# Patient Record
Sex: Female | Born: 1935 | Race: White | Hispanic: No | Smoking: Never smoker
Health system: Southern US, Community
[De-identification: ages and names within clinical notes are randomized; demographics above are authoritative.]

## PROBLEM LIST (undated history)

## (undated) DIAGNOSIS — I10 Essential (primary) hypertension: Secondary | ICD-10-CM

## (undated) DIAGNOSIS — M1990 Unspecified osteoarthritis, unspecified site: Secondary | ICD-10-CM

## (undated) DIAGNOSIS — N289 Disorder of kidney and ureter, unspecified: Secondary | ICD-10-CM

## (undated) DIAGNOSIS — K222 Esophageal obstruction: Secondary | ICD-10-CM

## (undated) HISTORY — DX: Esophageal obstruction: K22.2

## (undated) HISTORY — DX: Essential (primary) hypertension: I10

## (undated) HISTORY — PX: CATARACT EXTRACTION: SUR2

## (undated) HISTORY — DX: Disorder of kidney and ureter, unspecified: N28.9

## (undated) HISTORY — DX: Unspecified osteoarthritis, unspecified site: M19.90

---

## 1998-07-24 ENCOUNTER — Ambulatory Visit (HOSPITAL_COMMUNITY): Admission: RE | Admit: 1998-07-24 | Discharge: 1998-07-24 | Payer: Self-pay | Admitting: Gastroenterology

## 1999-07-01 ENCOUNTER — Encounter: Payer: Self-pay | Admitting: Internal Medicine

## 1999-07-01 ENCOUNTER — Inpatient Hospital Stay (HOSPITAL_COMMUNITY): Admission: EM | Admit: 1999-07-01 | Discharge: 1999-07-10 | Payer: Self-pay | Admitting: Emergency Medicine

## 1999-07-01 ENCOUNTER — Encounter (INDEPENDENT_AMBULATORY_CARE_PROVIDER_SITE_OTHER): Payer: Self-pay

## 1999-07-03 ENCOUNTER — Encounter: Payer: Self-pay | Admitting: Internal Medicine

## 1999-07-07 ENCOUNTER — Encounter: Payer: Self-pay | Admitting: General Surgery

## 2000-09-10 ENCOUNTER — Other Ambulatory Visit: Admission: RE | Admit: 2000-09-10 | Discharge: 2000-09-10 | Payer: Self-pay | Admitting: Obstetrics and Gynecology

## 2000-09-10 ENCOUNTER — Encounter (INDEPENDENT_AMBULATORY_CARE_PROVIDER_SITE_OTHER): Payer: Self-pay

## 2001-03-29 ENCOUNTER — Ambulatory Visit (HOSPITAL_COMMUNITY): Admission: RE | Admit: 2001-03-29 | Discharge: 2001-03-29 | Payer: Self-pay | Admitting: Gastroenterology

## 2001-04-06 ENCOUNTER — Ambulatory Visit (HOSPITAL_COMMUNITY): Admission: RE | Admit: 2001-04-06 | Discharge: 2001-04-06 | Payer: Self-pay | Admitting: Gastroenterology

## 2001-04-06 ENCOUNTER — Encounter: Payer: Self-pay | Admitting: Gastroenterology

## 2006-07-07 ENCOUNTER — Ambulatory Visit: Payer: Self-pay | Admitting: Internal Medicine

## 2006-07-21 ENCOUNTER — Ambulatory Visit: Payer: Self-pay | Admitting: Internal Medicine

## 2007-05-23 ENCOUNTER — Ambulatory Visit: Payer: Self-pay | Admitting: Internal Medicine

## 2007-05-23 DIAGNOSIS — I129 Hypertensive chronic kidney disease with stage 1 through stage 4 chronic kidney disease, or unspecified chronic kidney disease: Secondary | ICD-10-CM | POA: Insufficient documentation

## 2007-05-23 DIAGNOSIS — M1990 Unspecified osteoarthritis, unspecified site: Secondary | ICD-10-CM | POA: Insufficient documentation

## 2007-05-23 DIAGNOSIS — M79609 Pain in unspecified limb: Secondary | ICD-10-CM

## 2008-09-03 ENCOUNTER — Ambulatory Visit (HOSPITAL_COMMUNITY): Admission: RE | Admit: 2008-09-03 | Discharge: 2008-09-03 | Payer: Self-pay | Admitting: Gastroenterology

## 2008-09-05 ENCOUNTER — Ambulatory Visit: Payer: Self-pay | Admitting: Internal Medicine

## 2008-09-05 DIAGNOSIS — H409 Unspecified glaucoma: Secondary | ICD-10-CM | POA: Insufficient documentation

## 2008-09-05 DIAGNOSIS — K222 Esophageal obstruction: Secondary | ICD-10-CM

## 2008-09-06 ENCOUNTER — Telehealth: Payer: Self-pay | Admitting: Internal Medicine

## 2008-09-06 LAB — CONVERTED CEMR LAB
ALT: 13 units/L (ref 0–35)
AST: 22 units/L (ref 0–37)
Albumin: 3.9 g/dL (ref 3.5–5.2)
Alkaline Phosphatase: 64 units/L (ref 39–117)
BUN: 21 mg/dL (ref 6–23)
Basophils Absolute: 0.1 10*3/uL (ref 0.0–0.1)
Basophils Relative: 2.2 % (ref 0.0–3.0)
Bilirubin, Direct: 0.1 mg/dL (ref 0.0–0.3)
CO2: 29 meq/L (ref 19–32)
Calcium: 9.1 mg/dL (ref 8.4–10.5)
Chloride: 109 meq/L (ref 96–112)
Creatinine, Ser: 1.1 mg/dL (ref 0.4–1.2)
Eosinophils Absolute: 0.2 10*3/uL (ref 0.0–0.7)
Eosinophils Relative: 3.8 % (ref 0.0–5.0)
GFR calc non Af Amer: 49.48 mL/min (ref >60)
Glucose, Bld: 125 mg/dL — ABNORMAL HIGH (ref 70–99)
HCT: 45.7 % (ref 36.0–46.0)
Hemoglobin: 15.4 g/dL — ABNORMAL HIGH (ref 12.0–15.0)
Lymphocytes Relative: 38.5 % (ref 12.0–46.0)
Lymphs Abs: 2.3 10*3/uL (ref 0.7–4.0)
MCHC: 33.6 g/dL (ref 30.0–36.0)
MCV: 84 fL (ref 78.0–100.0)
Monocytes Absolute: 0.6 10*3/uL (ref 0.1–1.0)
Monocytes Relative: 9.5 % (ref 3.0–12.0)
Neutro Abs: 2.7 10*3/uL (ref 1.4–7.7)
Neutrophils Relative %: 46 % (ref 43.0–77.0)
Platelets: 168 10*3/uL (ref 150.0–400.0)
Potassium: 3.8 meq/L (ref 3.5–5.1)
RBC: 5.44 M/uL — ABNORMAL HIGH (ref 3.87–5.11)
RDW: 13.6 % (ref 11.5–14.6)
Sodium: 144 meq/L (ref 135–145)
TSH: 1.18 microintl units/mL (ref 0.35–5.50)
Total Bilirubin: 0.7 mg/dL (ref 0.3–1.2)
Total Protein: 6.8 g/dL (ref 6.0–8.3)
WBC: 5.9 10*3/uL (ref 4.5–10.5)

## 2009-01-15 ENCOUNTER — Ambulatory Visit: Payer: Self-pay | Admitting: Internal Medicine

## 2009-01-15 DIAGNOSIS — R269 Unspecified abnormalities of gait and mobility: Secondary | ICD-10-CM

## 2009-04-17 ENCOUNTER — Ambulatory Visit: Payer: Self-pay | Admitting: Internal Medicine

## 2009-04-17 DIAGNOSIS — R7309 Other abnormal glucose: Secondary | ICD-10-CM

## 2009-05-08 ENCOUNTER — Telehealth: Payer: Self-pay | Admitting: Internal Medicine

## 2009-05-30 ENCOUNTER — Telehealth: Payer: Self-pay | Admitting: Internal Medicine

## 2009-06-17 ENCOUNTER — Ambulatory Visit (HOSPITAL_COMMUNITY): Admission: RE | Admit: 2009-06-17 | Discharge: 2009-06-17 | Payer: Self-pay | Admitting: Gastroenterology

## 2009-07-03 ENCOUNTER — Ambulatory Visit (HOSPITAL_COMMUNITY): Admission: RE | Admit: 2009-07-03 | Discharge: 2009-07-03 | Payer: Self-pay | Admitting: Gastroenterology

## 2009-09-23 ENCOUNTER — Ambulatory Visit: Payer: Self-pay | Admitting: Internal Medicine

## 2009-09-23 LAB — CONVERTED CEMR LAB
ALT: 18 units/L (ref 0–35)
AST: 18 units/L (ref 0–37)
Albumin: 3.9 g/dL (ref 3.5–5.2)
BUN: 24 mg/dL — ABNORMAL HIGH (ref 6–23)
Basophils Absolute: 0.1 10*3/uL (ref 0.0–0.1)
Chloride: 104 meq/L (ref 96–112)
Creatinine, Ser: 1.4 mg/dL — ABNORMAL HIGH (ref 0.4–1.2)
Eosinophils Relative: 5.8 % — ABNORMAL HIGH (ref 0.0–5.0)
GFR calc non Af Amer: 37.37 mL/min (ref >60)
Glucose, Bld: 104 mg/dL — ABNORMAL HIGH (ref 70–99)
HCT: 42.5 % (ref 36.0–46.0)
Hgb A1c MFr Bld: 5.8 % (ref 4.6–6.5)
Lymphocytes Relative: 36.5 % (ref 12.0–46.0)
Lymphs Abs: 2.2 10*3/uL (ref 0.7–4.0)
Monocytes Relative: 9.8 % (ref 3.0–12.0)
Platelets: 194 10*3/uL (ref 150.0–400.0)
RDW: 13.3 % (ref 11.5–14.6)
TSH: 2.75 microintl units/mL (ref 0.35–5.50)
Total Bilirubin: 0.5 mg/dL (ref 0.3–1.2)
WBC: 6.1 10*3/uL (ref 4.5–10.5)

## 2009-09-27 ENCOUNTER — Ambulatory Visit: Payer: Self-pay | Admitting: Internal Medicine

## 2009-09-27 DIAGNOSIS — N259 Disorder resulting from impaired renal tubular function, unspecified: Secondary | ICD-10-CM

## 2009-10-29 ENCOUNTER — Telehealth: Payer: Self-pay | Admitting: Internal Medicine

## 2010-07-01 NOTE — Progress Notes (Signed)
Summary: REQ FOR RX  Phone Note Call from Patient   Caller: Daughter Adell Singletary - [phone] Summary of Call: [place]. She is c/o bursitis in her elbow and is req rx to help.  Initial call taken by: Lamar Sprinkles, CMA,  Oct 29, 2009 9:02 AM  Follow-up for Phone Call        Is it red and swollen?  Follow-up by: Tresa Garter MD,  Oct 29, 2009 2:21 PM  Additional Follow-up for Phone Call Additional follow up Details #1::        Pt c/o arm pain, between elbow and shoulder. Pain is w/movement. No swelling, redness, heat or recent injury. She has taken nothing OTC due to glaucoma.  Additional Follow-up by: Lamar Sprinkles, CMA,  Oct 29, 2009 3:51 PM    Additional Follow-up for Phone Call Additional follow up Details #2::    Use Voltaren gel two times a day Ice Advil 1 tab two times a day pc x 1 wk Go to UC if bad Follow-up by: Tresa Garter MD,  Oct 29, 2009 5:30 PM  Additional Follow-up for Phone Call Additional follow up Details #3:: Details for Additional Follow-up Action Taken: Pt informed, [facility] Additional Follow-up by: Lamar Sprinkles, CMA,  October 30, 2009 8:38 AM  Prescriptions: VOLTAREN 1 %  GEL (DICLOFENAC SODIUM) two times a day as needed pain  #1 mth x 0   Entered by:   Lamar Sprinkles, CMA   Authorized by:   Tresa Garter MD   Signed by:   Lamar Sprinkles, CMA on 10/30/2009   Method used:   [facility]ron[facility] to        [facility]  [phone]* (retail)       [address] [place], [place]  [address]       Ph: [phone] or [phone]       Fax: [phone]   RxID:   [id number]

## 2010-07-01 NOTE — Assessment & Plan Note (Signed)
Summary: 6 MO ROV /NWS   Vital Signs:  Patient profile:   77 year old female Height:      59 inches (149.86 cm) Weight:      136.50 pounds (62.05 kg) BMI:     27.67 O2 Sat:      95 % on Room air Temp:     97.5 degrees F (36.39 degrees C) oral Pulse rate:   77 / minute BP sitting:   140 / 80  (left arm) Cuff size:   regular  Vitals Entered By: Josph Macho RMA (September 27, 2009 3:56 PM)  O2 Flow:  Room air CC: 6 month follow up/ Pts daughter states pt is no longer taking Naprosyn or using Voltarn gel/ pt is using 2 different eye drops but doesn't remember name of them/ CF Is Patient Diabetic? No   CC:  6 month follow up/ Pts daughter states pt is no longer taking Naprosyn or using Voltarn gel/ pt is using 2 different eye drops but doesn't remember name of them/ CF.  History of Present Illness: F/u HTN, OA, GERD w/strictures  Current Medications (verified): 1)  Naprosyn 500 Mg Tabs (Naproxen) .Marland Kitchen.. 1 Two Times A Day Pc As Needed Pain 2)  Voltaren 1 %  Gel (Diclofenac Sodium) .... Two Times A Day As Needed Pain 3)  Vitamin D3 1000 Unit  Tabs (Cholecalciferol) .Marland Kitchen.. 1 By Mouth Daily 4)  Tribenzor 40-5-12.5 Mg Tabs (Olmesartan-Amlodipine-Hctz) .Marland Kitchen.. 1 By Mouth Qd 5)  Prilosec .... Once Daily  Allergies (verified): No Known Drug Allergies  Past History:  Social History: Last updated: 09/05/2008 Retired Single moved to the [employer] [address]  Past Medical History: Hypertension Osteoarthritis Glaucoma Esoph strictures Dr Kinnie Scales Renal insufficiency  Past Surgical History: Cataract extraction  Review of Systems  The patient denies weight loss, weight gain, chest pain, and prolonged cough.    Physical Exam  General:  Well-developed,well-nourished,in no acute distress; alert,appropriate and cooperative throughout examination Nose:  External nasal examination shows no deformity or inflammation. Nasal mucosa are pink and moist without lesions or  exudates. Mouth:  Oral mucosa and oropharynx without lesions or exudates.  Teeth in good repair. Lungs:  Normal respiratory effort, chest expands symmetrically. Lungs are clear to auscultation, no crackles or wheezes. Heart:  Normal rate and regular rhythm. S1 and S2 normal without gallop, murmur, click, rub or other extra sounds. Abdomen:  Bowel sounds positive,abdomen soft and non-tender without masses, organomegaly or hernias noted. Neurologic:  alert & oriented X3, DTRs symmetrical and normal, Romberg negative, and ataxic gait.   Psych:  Oriented X3 and good eye contact.     Impression & Recommendations:  Problem # 1:  OSTEOARTHRITIS (ICD-715.90) Assessment Deteriorated  Her updated medication list for this problem includes:    Naprosyn 500 Mg Tabs (Naproxen) .Marland Kitchen... 1 two times a day pc as needed pain  Problem # 2:  ABNORMALITY OF GAIT (ICD-781.2) Assessment: Unchanged See "Patient Instructions".   Problem # 3:  HYPERTENSION (ICD-401.9) Assessment: Improved  Her updated medication list for this problem includes:    Tribenzor 40-5-12.5 Mg Tabs (Olmesartan-amlodipine-hctz) .Marland Kitchen... 1 by mouth qd  Problem # 4:  GLAUCOMA NOS (ICD-365.9) Assessment: Unchanged On prescription drug  therapy   Complete Medication List: 1)  Naprosyn 500 Mg Tabs (Naproxen) .Marland Kitchen.. 1 two times a day pc as needed pain 2)  Voltaren 1 % Gel (Diclofenac sodium) .... Two times a day as needed pain 3)  Vitamin D3 1000 Unit Tabs (Cholecalciferol) .Marland KitchenMarland KitchenMarland Kitchen  1 by mouth daily 4)  Tribenzor 40-5-12.5 Mg Tabs (Olmesartan-amlodipine-hctz) .Marland Kitchen.. 1 by mouth qd 5)  Prilosec  .... Once daily  Patient Instructions: 1)  Start Silver Sne[facility] at [facility] 2)  Please schedule a follow-up appointment in 6 months. 3)  BMP prior to visit, ICD-9: 4)  Hepatic Panel prior to visit, ICD-9: 401.1  995.20 5)  TSH prior to visit, ICD-9: 6)  CBC w/ Diff prior to visit, ICD-9: 7)  HbgA1C prior to visit, ICD-9:

## 2010-08-29 ENCOUNTER — Telehealth: Payer: Self-pay | Admitting: *Deleted

## 2010-08-29 NOTE — Telephone Encounter (Signed)
Pt "turned" her ankle Sunday. She c/o swelling and pain but states it improved in a couple days. There is still some discomfort and swelling. She has be elevating foot, used ice in the first few days and ibuprofen for pain. Her family was worried there still may be a fracture. I offered an apt Saturday but she wants an apt with you. I told her to call on call service this weekend for advisement if symptoms changed and would call her back Monday for update. Ok to work in next week if not better?

## 2010-09-01 NOTE — Telephone Encounter (Signed)
Spoke with patient, who states she is not much better, she request to see MD. Per scheduler pt can be seen 09/04/10 at 9am.  Pt notified

## 2010-09-01 NOTE — Telephone Encounter (Signed)
Left pt vm to call office back to see how she was doing today.

## 2010-09-01 NOTE — Telephone Encounter (Signed)
OK to w/in in acute OPEN slot if we have it. Thank you!

## 2010-09-04 ENCOUNTER — Ambulatory Visit (INDEPENDENT_AMBULATORY_CARE_PROVIDER_SITE_OTHER)
Admission: RE | Admit: 2010-09-04 | Discharge: 2010-09-04 | Disposition: A | Discharge status: Home / Self Care | Payer: Medicare HMO | Source: Ambulatory Visit | Attending: Internal Medicine | Admitting: Internal Medicine

## 2010-09-04 ENCOUNTER — Encounter: Payer: Self-pay | Admitting: Internal Medicine

## 2010-09-04 ENCOUNTER — Ambulatory Visit (INDEPENDENT_AMBULATORY_CARE_PROVIDER_SITE_OTHER): Payer: Medicare HMO | Admitting: Internal Medicine

## 2010-09-04 ENCOUNTER — Telehealth: Payer: Self-pay | Admitting: *Deleted

## 2010-09-04 DIAGNOSIS — S93409A Sprain of unspecified ligament of unspecified ankle, initial encounter: Secondary | ICD-10-CM

## 2010-09-04 DIAGNOSIS — I10 Essential (primary) hypertension: Secondary | ICD-10-CM

## 2010-09-04 MED ORDER — OLMESARTAN-AMLODIPINE-HCTZ 40-5-12.5 MG PO TABS: 1.0000 | ORAL_TABLET | Freq: Every day | ORAL | Status: DC

## 2010-09-04 NOTE — Assessment & Plan Note (Signed)
ACE wrap, elevation Will xray today

## 2010-09-04 NOTE — Telephone Encounter (Signed)
Pt has fracture - Per MD needs to see ortho. I set pt up for apt w/ [facility] today at 2:40 - to arrive 30 min prior. Daughter and patient aware.

## 2010-09-04 NOTE — Progress Notes (Signed)
  Subjective:    Patient ID: Mckenzie Young, female    DOB: 01-Sep-1915, 77 y.o.   MRN: [id number]  HPI  C/o L ankle sprained 1 week ago. C/o pain and swelling. F/u HTN  Review of Systems  Respiratory: Negative for cough and chest tightness.   Cardiovascular: Positive for leg swelling (L). Negative for chest pain and palpitations.  Genitourinary: Negative for difficulty urinating.       Objective:   Physical Exam  Constitutional: She appears well-developed. No distress.  Eyes: Pupils are equal, round, and reactive to light.  Neck: No thyromegaly present.  Cardiovascular: Normal heart sounds.   Pulmonary/Chest: She has no rales.  Abdominal: She exhibits no distension.  Musculoskeletal:       L ankle - as above  Skin: Skin is warm. No erythema.      L ankle is bruised and swollen, tender laterally    Assessment & Plan:    Ankle sprain L ACE wrap, elevation Will xray today  HYPERTENSION Will renew meds

## 2010-09-04 NOTE — Assessment & Plan Note (Signed)
Will renew meds

## 2010-09-04 NOTE — Telephone Encounter (Signed)
Thank you :)

## 2010-09-04 NOTE — Patient Instructions (Signed)
Elevate left ankle

## 2010-09-11 ENCOUNTER — Other Ambulatory Visit: Payer: Self-pay | Admitting: Internal Medicine

## 2010-09-11 NOTE — Telephone Encounter (Signed)
OK to fill this prescription with additional refills x11 Thank you!  

## 2010-10-10 ENCOUNTER — Ambulatory Visit: Payer: Self-pay | Admitting: Internal Medicine

## 2010-10-14 NOTE — H&P (Signed)
NAME:  Mckenzie Young, Mckenzie Young             ACCOUNT NO.:  [id number]   MEDICAL RECORD NO.:  [id number]          PATIENT TYPE:  AMB   LOCATION:  ENDO                         FACILITY:  [facility]   PHYSICIAN:  James L. Malon Kindle., M.D.DATE OF BIRTH:  [birth year]   DATE OF ADMISSION:  09/03/2008  DATE OF DISCHARGE:                              HISTORY & PHYSICAL   REASONS FOR EVALUATION:  Obstructive esophagus.   HISTORY:  A 77 year old patient who has seen Dr. Kinnie Scales.  She had an  endoscopy with removal of mid impaction 96 and apparently it has been  dilated with a Savary dilation in 2002.  She was eating lamb last night  and feels that it has not passed.  She has had continued regurgitation.  She was sent over to remove any mid impaction, repair of a rectal  fistula, tonsillectomy, D and C, cholecystectomy, and cataract surgery.   SOCIAL HISTORY:  She is was living independently at 62, has a daughter,  does not smoke or drink.   PHYSICAL EXAMINATION:  VITAL SIGNS:  Blood pressure 203/94, pulse 79,  sinus __________ masses.  HEART:  Regular rate and rhythm.  No murmurs or gallops.  LUNGS:  Clear.  ABDOMEN:  Soft and nontender.   ASSESSMENT:  1. Obstructive esophagus over 24 hours.  This will be quite risky if      we try to get this out if it is impacted.  I have gone over this in      detail with her including the fact that she could have a      perforation and may even need to go to Surgery to have this removed      by a thoracic surgeon.  2. Hypertension.  This definitely needs to be treated.   PLAN:  We will go ahead with an endoscopy today, rest discussed as  above.            ______________________________  Llana Aliment. Malon Kindle., M.D.     Waldron Session  D:  09/03/2008  T:  09/04/2008  Job:  [id number]   cc:   Georgina Quint. Plotnikov, MD  Griffith Citron, M.D.

## 2010-10-14 NOTE — Op Note (Signed)
NAME:  Mckenzie Young, Mckenzie Young             ACCOUNT NO.:  [id number]   MEDICAL RECORD NO.:  [id number]          PATIENT TYPE:  AMB   LOCATION:  ENDO                         FACILITY:  [facility]   PHYSICIAN:  James L. Malon Kindle., M.D.DATE OF BIRTH:  10-10-1915   DATE OF PROCEDURE:  09/03/2008  DATE OF DISCHARGE:                               OPERATIVE REPORT   SURGEON:  Fayrene Fearing L. Edwards, MD   PROCEDURE:  Esophagogastroduodenoscopy with removal of food impaction.   MEDICATIONS:  Hurricaine spray, fentanyl 25 mcg, Versed 3 mg IV.   INDICATIONS:  The patient had a history of esophageal stricture and food  impaction of the esophagus, comes in now with lamb that has been in her  esophagus for over 24 hours.  She also was quite hypertensive.   DESCRIPTION OF PROCEDURE:  The procedure including the potential risk  and benefits have been explained to her and consent obtained.  Left  lateral decubitus position, the Pentax scope was inserted and advanced.  The distal esophagus was reached and cleared.  There was a large meat  bolus in the esophagus.  We then used the [employer],  were able to work this surrounded and gently dislodged it and removed it  through the mouth.  The scope was then reinserted and advanced.  The  distal esophagus did reveal stricture with marked inflammation.  The  stomach was easily entered.  Pylorus identified and passed.  Duodenum  including bulb and second portion was seen well.  The scope was  withdrawn and the additional findings confirmed.  There were no  immediate complications.   ASSESSMENT:  1. Food impaction at the esophagus removed.  2. Inflamed stricture.  3. Elevated blood pressure.   PLAN:  We will start on over-the-counter Prilosec and clear liquids for  today; however, followup with Dr. Kinnie Scales in the future to see about and  dilatation and will have her followup with Dr. Posey Rea regarding her  blood pressure.     ______________________________  Llana Aliment. Malon Kindle., M.D.     Waldron Session  D:  09/03/2008  T:  09/04/2008  Job:  [id number]   cc:   Georgina Quint. Plotnikov, MD  Griffith Citron, M.D.

## 2010-10-17 NOTE — Consult Note (Signed)
[place]  Patient:    JUMANA, PACCIONE                      MRN: [id number] Proc. Date: 07/02/99 Attending:  Griffith Citron, M.D. CC:         Sonda Primes, M.D. [facility]             Timothy E. Earlene Plater, M.D.                          Consultation Report  REASON FOR CONSULTATION:  Asked by Dr. Posey Rea to evaluate Ms. Ladona Ridgel for abdominal pain and abnormal liver enzymes.  HISTORY OF PRESENT ILLNESS:  Acute onset of symptoms awakening from sleep early  Sunday morning.  Pain was right upper quadrant, epigastric, radiating into the back.  Subsequent nausea, vomiting with some improvement.  Symptoms have recurred the following day and she was ultimately admitted to the hospital yesterday due to persistent intractable nausea, vomiting with right upper quadrant abdominal pain. The patient has no prior history of any similar episode.  On admission, she was  found to be acutely ill, afebrile with stable vital signs.  The examination was  notable for abdominal pain in the periumbilical region.  Surgical consultation as obtained from Dr. Lorelee New, who has been following the patient over the past 48 hours.  Admission labs revealed bilirubin 7.3, alkaline phosphatase 206, AST 102, ALT 135. Abdominal ultrasound revealed cholelithiasis with a thickened gallbladder wall, but without pericholecystic fluid.  No intrahepatic or extrahepatic bile duct dilation. Normal appearing pancreas.  The patient was thought to have chronic cholecystitis with cholelithiasis.  Multiple renal cysts were also noted.  The patients abdominal pain has improved over the past 36 hours.  However, she developed a rapid heart rate and ultimately went into atrial fibrillation. With this she has mild tachypnea and dyspnea on exertion.  PAST MEDICAL HISTORY:  GERD, hypertension.  MEDICAL THERAPY:  Avapro, Aciphex.  ALLERGIES:  No known drug allergies.  SOCIAL HISTORY:   The patient lives with husband of 60 years in an apartment. No alcohol, tobacco, or drug use.  The patient is [employer], not practicing. Two daughters ages 69 and 25 who are healthy.  FAMILY HISTORY:  Parents deceased, mother from CVA, father uncertain cause. Sister deceased from uncertain cause.  Two brothers deceased, 71 and 61, both from CVA.  OBJECTIVE:  GENERAL:  Mildly ill-appearing.  Alert, oriented.  No acute distress.  Mild tachypnea.  VITAL SIGNS:  Stable except respirations 22 per minute.  Pulse 100, irregularly  irregular.  Cardizem drip initiated.  HEENT:  Icteric sclerae.  No pallor.  EOMI.  PERRL.  Dry oropharyngeal mucosa.   NECK:  Supple with increased JVD to the angle of the jaw at 30 degrees.  No adenopathy.  No thyromegaly.  CHEST:  Bibasilar rales, right greater than left.  No consolidation.  No wheezing.  CARDIAC:  Irregularly irregular, rapid rhythm.  No gallop.  No murmur appreciated.  ABDOMEN:  Obese, soft.  Tender right upper quadrant without guarding or rebound. No palpable organomegaly.  No mass or firmness.  Bowel sounds are present. Nondistended.  No rebound or guarding.  RECTAL:  Not performed.  EXTREMITIES:  No clubbing, cyanosis, or edema.  Abdominal ultrasound:  Cholelithiasis with thick walled gallbladder.  No pericholecystic fluid.  No intra or extrahepatic bile duct dilation.  LABORATORY DATA:  CMET abnormal for total bilirubin 7.3,  alkaline phosphatase 206, AST 102, ALT 135.  CBC normal except WBC 15,600.  Normal amylase and lipase.  ASSESSMENT: 1. Acute/chronic calculus cholecystitis. 2. Bile duct stone-possible spontaneous passage given evidence of cholestasis    probably due to transient extrahepatic obstruction.  Retained stone may still be    present with delayed dilation of the biliary tree which typically takes three to    four days before manifesting.  Therefore, initial ultrasound may have been too    early to  demonstrate extrahepatic obstruction and may need to be repeated if  liver enzymes worsen. 3. Atrial fibrillation, new onset with mild congestive heart failure.  RECOMMENDATION: 1. Keep n.p.o. to monitor lab-rising LFTs would suggest extrahepatic obstruction    secondary to common bile duct stone.  This in turn should precipitate repeat  abdominal ultrasound, then consideration for an ERCP. 2. Cholecystectomy with intraoperative cholangiogram once cardiac status    stabilizes. 3. Continue antibiotic coverage. 4. I will be gone for the next 48 hours.  I will see the patient upon my return  Friday afternoon.  I can be reached at [phone] for further discussion. DD:  07/02/99 TD:  07/02/99 Job: [id number]

## 2010-10-17 NOTE — Assessment & Plan Note (Signed)
[facility]                           PRIMARY CARE OFFICE NOTE   Mckenzie Young, Mckenzie Young                      MRN:          [id number]  DATE:07/08/2006                            DOB:          03-Nov-1915    The patient is an 77 year old female who has not been seen here in over  three years, comes in today urgently with complaint of severe shooting  pain in the right side of her head that started a day or two ago.  Shooting pains last for a split second, go behind and above the right  ear.  They start in the neck area.   PAST MEDICAL HISTORY:  Hypertension, GERD, dyslipidemia, history of  elevated glucose.   PAST SURGICAL HISTORY:  Cholecystectomy.   [place]IAL H[place]ORY[place].  She does not smoke or drink alcohol.   FAMILY HISTORY:  Both parents deceased.   REVIEW OF SYSTEMS:  No chest pain or shortness of breath.  Headache as  above.  No nausea and vomiting.  The rest of 18 point review of systems  is negative.   CURRENT MEDICATIONS:  Avapro 300 mg daily, Clonidine 0.2 mg b.i.d.,  AcipHex 20 mg daily, baby aspirin daily.   PHYSICAL EXAMINATION:  VITAL SIGNS:  Blood pressure 174/97, pulse 110,  temperature 97, weight 137 pounds.  GENERAL:  She is in no acute distress.  HEENT:  Moist mucosa.  NECK:  Supple, no thyromegaly or bruit.  LUNGS:  Clear, no wheezes or rales.  HEART:  S1 and S2, no murmur, no gallop.  ABDOMEN:  Soft, nontender, no organomegaly, no masses felt.  EXTREMITIES:  Lower extremities without edema.  NEUROLOGICAL:  She is alert, oriented, and cooperative.  Denies being  depressed.  SKIN:  Examination reveals three groups of vesicals on the raised  surface, small on the right side of the neck, similar patch over the  mastoid process on the right, and a patch measuring about 5 by 5 cm over  the right temporal area.  Very tender to palpation.   ASSESSMENT/PLAN:  1.  Headache, neuropathic, related to problem #2.  Prescribed Neurontin      100 mg, from 1 to 3 twice a day, Darvocet N100, 1 q.i.d. p.r.n.      pain.  If pain is mild, she can take Tylenol or Advil.  2. H. zoster.  Acyclovir 800 p.o. five times a day for seven days.  3. Hypertension with white coat component, she states it has been      normal at home.   I will see her back in two weeks.     Georgina Quint. Plotnikov, MD  Electronically Signed    AVP/MedQ  DD: 07/08/2006  DT: 07/08/2006  Job #: [id number]

## 2010-12-01 ENCOUNTER — Telehealth: Payer: Self-pay

## 2010-12-01 NOTE — Telephone Encounter (Signed)
Phamacist called stating that tribenzor is too expensive for patient, $170 after discount card. Please advise on alt med for patient. Thanks

## 2010-12-01 NOTE — Telephone Encounter (Signed)
Pls ask the pt if she is willing to switch Thx

## 2010-12-02 NOTE — Telephone Encounter (Signed)
Pt is willing to change - exforge is covered med. I  Gave pt samples x 8 wks for now. What would you like to change pt to?

## 2010-12-02 NOTE — Telephone Encounter (Signed)
OK Exforge hct 5-320-12.5 1 po qd Thx

## 2010-12-08 ENCOUNTER — Telehealth: Payer: Self-pay | Admitting: *Deleted

## 2010-12-08 NOTE — Telephone Encounter (Signed)
Dr Posey Rea, Exforge dosages are as follows: 10-320/160-25/12.5 and 5-160-25/12.5. Please advise on correct dosage? Thank you!

## 2010-12-08 NOTE — Telephone Encounter (Signed)
rec fax from pharm stating Tribenzor is no longer covered and is too expensive even with coupon. Please change to cheaper alt.

## 2010-12-08 NOTE — Telephone Encounter (Signed)
5-160-12.5 Thx

## 2010-12-08 NOTE — Telephone Encounter (Signed)
See other note Thx 

## 2010-12-09 MED ORDER — AMLODIPINE-VALSARTAN-HCTZ 5-160-12.5 MG PO TABS: 1.0000 | ORAL_TABLET | Freq: Every day | ORAL | Status: DC

## 2010-12-09 NOTE — Telephone Encounter (Signed)
Med has been changed to Exforge 5-160-12.5

## 2011-03-12 ENCOUNTER — Ambulatory Visit: Payer: Medicare HMO | Admitting: Internal Medicine

## 2011-03-13 ENCOUNTER — Telehealth: Payer: Self-pay | Admitting: *Deleted

## 2011-03-13 NOTE — Telephone Encounter (Signed)
Pt left vm requesting appt before November for her swollen/painful finger. I advised her Dr. Posey Rea is out of office all week next week of 03/16/11. I transferred her to scheduler to see who she can see next week.

## 2011-03-23 ENCOUNTER — Telehealth: Payer: Self-pay | Admitting: *Deleted

## 2011-03-23 NOTE — Telephone Encounter (Signed)
Pt left vm c/o 1st finger pain/edema. ? From gout. She is requesting Rx for this since we have no openings this week. Please advise.

## 2011-03-23 NOTE — Telephone Encounter (Signed)
OV w/any MD Try Advil 2 tabs bid pc Thx

## 2011-03-24 NOTE — Telephone Encounter (Signed)
Pt informed/transferred to scheduler.  

## 2011-03-25 ENCOUNTER — Other Ambulatory Visit (INDEPENDENT_AMBULATORY_CARE_PROVIDER_SITE_OTHER): Payer: Medicare HMO

## 2011-03-25 ENCOUNTER — Ambulatory Visit (INDEPENDENT_AMBULATORY_CARE_PROVIDER_SITE_OTHER): Payer: Medicare HMO | Admitting: Internal Medicine

## 2011-03-25 VITALS — BP 162/90 | HR 90 | Temp 97.6°F

## 2011-03-25 DIAGNOSIS — I10 Essential (primary) hypertension: Secondary | ICD-10-CM

## 2011-03-25 DIAGNOSIS — M79644 Pain in right finger(s): Secondary | ICD-10-CM

## 2011-03-25 DIAGNOSIS — M109 Gout, unspecified: Secondary | ICD-10-CM

## 2011-03-25 DIAGNOSIS — M79609 Pain in unspecified limb: Secondary | ICD-10-CM

## 2011-03-25 DIAGNOSIS — N259 Disorder resulting from impaired renal tubular function, unspecified: Secondary | ICD-10-CM

## 2011-03-25 LAB — BASIC METABOLIC PANEL
BUN: 24 mg/dL — ABNORMAL HIGH (ref 6–23)
GFR: 28.05 mL/min — ABNORMAL LOW (ref >60.00)
Glucose, Bld: 116 mg/dL — ABNORMAL HIGH (ref 70–99)
Potassium: 5.2 mEq/L — ABNORMAL HIGH (ref 3.5–5.1)

## 2011-03-25 MED ORDER — MELOXICAM 7.5 MG PO TABS: 7.5000 mg | ORAL_TABLET | Freq: Every day | ORAL | Status: DC

## 2011-03-25 MED ORDER — AMLODIPINE BESYLATE-VALSARTAN 5-160 MG PO TABS: 1.0000 | ORAL_TABLET | Freq: Every day | ORAL | Status: DC

## 2011-03-26 ENCOUNTER — Encounter: Payer: Self-pay | Admitting: Internal Medicine

## 2011-03-26 ENCOUNTER — Telehealth: Payer: Self-pay | Admitting: Internal Medicine

## 2011-03-26 NOTE — Telephone Encounter (Signed)
Patient informed. 

## 2011-03-26 NOTE — Telephone Encounter (Signed)
Mckenzie Young, please, inform patient that her gout test was up. Meds as we discussed. Keep ROV Thx

## 2011-03-26 NOTE — Telephone Encounter (Signed)
Number busy

## 2011-03-26 NOTE — Assessment & Plan Note (Signed)
Monitor K, creat

## 2011-03-26 NOTE — Assessment & Plan Note (Signed)
See Med changes 

## 2011-03-26 NOTE — Assessment & Plan Note (Signed)
Remove HCTZ Meloxicam

## 2011-03-26 NOTE — Assessment & Plan Note (Signed)
Labs Meloxicam

## 2011-03-26 NOTE — Progress Notes (Signed)
  Subjective:    Patient ID: Mckenzie Young, female    DOB: 1915-11-26, 77 y.o.   MRN: [id number]  HPI  C/o R index finger pain and swelling x 1 wk - not better w/rest F/u HTN, gout, CRI  Review of Systems  Constitutional: Negative for chills, activity change, appetite change, fatigue and unexpected weight change.  HENT: Negative for congestion, mouth sores and sinus pressure.   Eyes: Negative for visual disturbance.  Respiratory: Negative for cough and chest tightness.   Gastrointestinal: Negative for nausea and abdominal pain.  Genitourinary: Negative for frequency, difficulty urinating and vaginal pain.  Musculoskeletal: Positive for joint swelling (as above). Negative for back pain and gait problem.  Skin: Negative for pallor and rash.  Neurological: Negative for dizziness, tremors, weakness, numbness and headaches.  Psychiatric/Behavioral: Negative for confusion and sleep disturbance.       Objective:   Physical Exam  Constitutional: She appears well-developed. No distress.       obese  HENT:  Head: Normocephalic.  Right Ear: External ear normal.  Left Ear: External ear normal.  Nose: Nose normal.  Mouth/Throat: Oropharynx is clear and moist.  Eyes: Conjunctivae are normal. Pupils are equal, round, and reactive to light. Right eye exhibits no discharge. Left eye exhibits no discharge.  Neck: Normal range of motion. Neck supple. No JVD present. No tracheal deviation present. No thyromegaly present.  Cardiovascular: Normal rate, regular rhythm and normal heart sounds.   Pulmonary/Chest: No stridor. No respiratory distress. She has no wheezes.  Abdominal: Soft. Bowel sounds are normal. She exhibits no distension and no mass. There is no tenderness. There is no rebound and no guarding.  Musculoskeletal: She exhibits edema and tenderness.       R index finger is fat and tender;  No erythema  Lymphadenopathy:    She has no cervical adenopathy.  Neurological: She displays  normal reflexes. No cranial nerve deficit. She exhibits normal muscle tone. Coordination normal.       cane  Skin: No rash noted. No erythema.  Psychiatric: She has a normal mood and affect. Her behavior is normal. Judgment and thought content normal.     Lab Results  Component Value Date   WBC 6.1 09/23/2009   HGB 14.5 09/23/2009   HCT 42.5 09/23/2009   PLT 194.0 09/23/2009   GLUCOSE 116* 03/25/2011   ALT 18 09/23/2009   AST 18 09/23/2009   NA 143 03/25/2011   K 5.2* 03/25/2011   CL 107 03/25/2011   CREATININE 1.8* 03/25/2011   BUN 24* 03/25/2011   CO2 28 03/25/2011   TSH 2.75 09/23/2009   HGBA1C 5.8 09/23/2009        Assessment & Plan:

## 2011-04-17 ENCOUNTER — Ambulatory Visit (INDEPENDENT_AMBULATORY_CARE_PROVIDER_SITE_OTHER): Payer: Medicare HMO | Admitting: Internal Medicine

## 2011-04-17 ENCOUNTER — Encounter: Payer: Self-pay | Admitting: Internal Medicine

## 2011-04-17 DIAGNOSIS — R269 Unspecified abnormalities of gait and mobility: Secondary | ICD-10-CM

## 2011-04-17 DIAGNOSIS — M79609 Pain in unspecified limb: Secondary | ICD-10-CM

## 2011-04-17 DIAGNOSIS — I10 Essential (primary) hypertension: Secondary | ICD-10-CM

## 2011-04-17 DIAGNOSIS — M79644 Pain in right finger(s): Secondary | ICD-10-CM

## 2011-04-17 DIAGNOSIS — M109 Gout, unspecified: Secondary | ICD-10-CM

## 2011-04-17 MED ORDER — AMLODIPINE-OLMESARTAN 5-40 MG PO TABS: 1.0000 | ORAL_TABLET | Freq: Every day | ORAL | Status: DC

## 2011-04-17 NOTE — Assessment & Plan Note (Signed)
Change meds to Azor

## 2011-04-17 NOTE — Assessment & Plan Note (Signed)
Continue with current prescription therapy as reflected on the Med list -- off HCTZ

## 2011-04-17 NOTE — Assessment & Plan Note (Signed)
Start PT 

## 2011-04-17 NOTE — Patient Instructions (Signed)
Stop Meloxicam when the gout is gone

## 2011-04-17 NOTE — Progress Notes (Signed)
  Subjective:    Patient ID: Mckenzie Young, female    DOB: Jul 28, 1915, 77 y.o.   MRN: [id number]  HPI  F/u gout and anxiety, finger pain  Review of Systems  Constitutional: Negative for chills, activity change, appetite change, fatigue and unexpected weight change.  HENT: Negative for congestion, mouth sores and sinus pressure.   Eyes: Negative for visual disturbance.  Respiratory: Negative for cough and chest tightness.   Gastrointestinal: Negative for nausea and abdominal pain.  Genitourinary: Negative for frequency, difficulty urinating and vaginal pain.  Musculoskeletal: Negative for back pain, arthralgias and gait problem.  Skin: Negative for pallor and rash.  Neurological: Negative for dizziness, tremors, weakness, numbness and headaches.  Psychiatric/Behavioral: Negative for confusion and sleep disturbance.       Objective:   Physical Exam  Constitutional: She appears well-developed and well-nourished. No distress.  HENT:  Head: Normocephalic.  Right Ear: External ear normal.  Left Ear: External ear normal.  Nose: Nose normal.  Mouth/Throat: Oropharynx is clear and moist.  Eyes: Conjunctivae are normal. Pupils are equal, round, and reactive to light. Right eye exhibits no discharge. Left eye exhibits no discharge.  Neck: Normal range of motion. Neck supple. No JVD present. No tracheal deviation present. No thyromegaly present.  Cardiovascular: Normal rate, regular rhythm and normal heart sounds.   Pulmonary/Chest: No stridor. No respiratory distress. She has no wheezes.  Abdominal: Soft. Bowel sounds are normal. She exhibits no distension and no mass. There is no tenderness. There is no rebound and no guarding.  Musculoskeletal: She exhibits edema and tenderness.       R index is less swollen and less tender  Lymphadenopathy:    She has no cervical adenopathy.  Neurological: She displays normal reflexes. No cranial nerve deficit. She exhibits normal muscle tone.  Coordination normal.  Skin: No rash noted. No erythema.  Psychiatric: She has a normal mood and affect. Her behavior is normal. Judgment and thought content normal.  Poor balance Lab Results  Component Value Date   WBC 6.1 09/23/2009   HGB 14.5 09/23/2009   HCT 42.5 09/23/2009   PLT 194.0 09/23/2009   GLUCOSE 116* 03/25/2011   ALT 18 09/23/2009   AST 18 09/23/2009   NA 143 03/25/2011   K 5.2* 03/25/2011   CL 107 03/25/2011   CREATININE 1.8* 03/25/2011   BUN 24* 03/25/2011   CO2 28 03/25/2011   TSH 2.75 09/23/2009   HGBA1C 5.8 09/23/2009          Assessment & Plan:

## 2011-04-17 NOTE — Assessment & Plan Note (Signed)
85% better Cont Melox until better

## 2011-04-19 DIAGNOSIS — H409 Unspecified glaucoma: Secondary | ICD-10-CM | POA: Insufficient documentation

## 2011-04-19 DIAGNOSIS — H401190 Primary open-angle glaucoma, unspecified eye, stage unspecified: Secondary | ICD-10-CM | POA: Insufficient documentation

## 2011-05-01 ENCOUNTER — Ambulatory Visit: Payer: Medicare HMO | Attending: Internal Medicine | Admitting: Physical Therapy

## 2011-05-01 DIAGNOSIS — IMO0001 Reserved for inherently not codable concepts without codable children: Secondary | ICD-10-CM | POA: Insufficient documentation

## 2011-05-01 DIAGNOSIS — R269 Unspecified abnormalities of gait and mobility: Secondary | ICD-10-CM | POA: Insufficient documentation

## 2011-05-01 DIAGNOSIS — M6281 Muscle weakness (generalized): Secondary | ICD-10-CM | POA: Insufficient documentation

## 2011-05-05 ENCOUNTER — Ambulatory Visit: Payer: Medicare HMO | Attending: Internal Medicine | Admitting: Physical Therapy

## 2011-05-05 DIAGNOSIS — R269 Unspecified abnormalities of gait and mobility: Secondary | ICD-10-CM | POA: Insufficient documentation

## 2011-05-05 DIAGNOSIS — IMO0001 Reserved for inherently not codable concepts without codable children: Secondary | ICD-10-CM | POA: Insufficient documentation

## 2011-05-05 DIAGNOSIS — M6281 Muscle weakness (generalized): Secondary | ICD-10-CM | POA: Insufficient documentation

## 2011-05-07 ENCOUNTER — Ambulatory Visit: Payer: Medicare HMO | Admitting: Physical Therapy

## 2011-05-11 ENCOUNTER — Ambulatory Visit: Payer: Medicare HMO | Admitting: Physical Therapy

## 2011-05-13 ENCOUNTER — Ambulatory Visit: Payer: Medicare HMO | Admitting: Physical Therapy

## 2011-05-18 ENCOUNTER — Encounter: Payer: Medicare HMO | Admitting: Physical Therapy

## 2011-05-20 ENCOUNTER — Ambulatory Visit: Payer: Medicare HMO | Admitting: Physical Therapy

## 2011-05-21 ENCOUNTER — Encounter: Payer: Medicare HMO | Admitting: Physical Therapy

## 2011-05-22 ENCOUNTER — Ambulatory Visit: Payer: Medicare HMO | Admitting: Physical Therapy

## 2011-05-27 ENCOUNTER — Ambulatory Visit: Payer: Medicare HMO | Admitting: Physical Therapy

## 2011-05-29 ENCOUNTER — Ambulatory Visit: Payer: Medicare HMO | Admitting: Physical Therapy

## 2011-06-01 ENCOUNTER — Ambulatory Visit: Payer: Medicare HMO | Admitting: Physical Therapy

## 2011-06-03 ENCOUNTER — Ambulatory Visit: Payer: Medicare Other | Attending: Internal Medicine | Admitting: Physical Therapy

## 2011-06-03 DIAGNOSIS — R269 Unspecified abnormalities of gait and mobility: Secondary | ICD-10-CM | POA: Insufficient documentation

## 2011-06-03 DIAGNOSIS — M6281 Muscle weakness (generalized): Secondary | ICD-10-CM | POA: Insufficient documentation

## 2011-06-03 DIAGNOSIS — IMO0001 Reserved for inherently not codable concepts without codable children: Secondary | ICD-10-CM | POA: Insufficient documentation

## 2011-06-10 ENCOUNTER — Ambulatory Visit: Payer: Medicare Other | Admitting: Physical Therapy

## 2011-06-12 ENCOUNTER — Ambulatory Visit: Payer: Medicare Other | Admitting: Physical Therapy

## 2011-06-17 ENCOUNTER — Ambulatory Visit: Payer: Medicare Other | Admitting: Physical Therapy

## 2011-06-19 ENCOUNTER — Ambulatory Visit: Payer: MEDICARE | Admitting: Physical Therapy

## 2011-06-24 ENCOUNTER — Ambulatory Visit: Payer: Medicare Other | Admitting: Physical Therapy

## 2011-06-26 ENCOUNTER — Encounter: Payer: Medicare HMO | Admitting: Physical Therapy

## 2011-06-29 ENCOUNTER — Ambulatory Visit: Payer: Medicare Other | Admitting: Physical Therapy

## 2011-07-01 ENCOUNTER — Ambulatory Visit: Payer: MEDICARE | Admitting: Physical Therapy

## 2011-08-06 ENCOUNTER — Telehealth: Payer: Self-pay | Admitting: *Deleted

## 2011-08-06 NOTE — Telephone Encounter (Signed)
I would be unable to do this as these are nonspecific symptoms that could be due to other factors, and would be needed to be considered and evaluated by the prescribing MD who knows her other co-morbidities (health issues)

## 2011-08-06 NOTE — Telephone Encounter (Signed)
Pt calling stating she cant take Azor due to side effects ie. Itching around her neck and ankle edema. She wants to know if she can have replacement or should she continue.  Please advise in PCP's absence. Thanks

## 2011-08-07 NOTE — Telephone Encounter (Signed)
Called patient left message with son in law to call back

## 2011-08-07 NOTE — Telephone Encounter (Signed)
P/t's daughter informed

## 2011-08-17 ENCOUNTER — Other Ambulatory Visit (INDEPENDENT_AMBULATORY_CARE_PROVIDER_SITE_OTHER): Payer: Medicare Other

## 2011-08-17 DIAGNOSIS — I10 Essential (primary) hypertension: Secondary | ICD-10-CM

## 2011-08-17 DIAGNOSIS — M109 Gout, unspecified: Secondary | ICD-10-CM

## 2011-08-17 LAB — BASIC METABOLIC PANEL
BUN: 32 mg/dL — ABNORMAL HIGH (ref 6–23)
Calcium: 9.2 mg/dL (ref 8.4–10.5)
GFR: 29.15 mL/min — ABNORMAL LOW (ref >60.00)
Glucose, Bld: 103 mg/dL — ABNORMAL HIGH (ref 70–99)

## 2011-08-21 ENCOUNTER — Encounter: Payer: Self-pay | Admitting: Internal Medicine

## 2011-08-21 ENCOUNTER — Ambulatory Visit (INDEPENDENT_AMBULATORY_CARE_PROVIDER_SITE_OTHER): Payer: Medicare Other | Admitting: Internal Medicine

## 2011-08-21 VITALS — BP 120/80 | HR 84 | Temp 97.1°F | Resp 16 | Wt 127.0 lb

## 2011-08-21 DIAGNOSIS — I10 Essential (primary) hypertension: Secondary | ICD-10-CM

## 2011-08-21 DIAGNOSIS — N259 Disorder resulting from impaired renal tubular function, unspecified: Secondary | ICD-10-CM

## 2011-08-21 DIAGNOSIS — M109 Gout, unspecified: Secondary | ICD-10-CM

## 2011-08-21 DIAGNOSIS — R269 Unspecified abnormalities of gait and mobility: Secondary | ICD-10-CM

## 2011-08-21 NOTE — Assessment & Plan Note (Signed)
Watching labs 

## 2011-08-21 NOTE — Assessment & Plan Note (Signed)
Resolved

## 2011-08-21 NOTE — Assessment & Plan Note (Signed)
BP is nl now 3/13: She had some itching and L ankle swelling x few days, stopped benyc/amlo - sx's resolved. She restarted - no sx now

## 2011-08-21 NOTE — Assessment & Plan Note (Signed)
Multifctorial S/p PT eval - given a cane

## 2011-08-21 NOTE — Progress Notes (Signed)
Patient ID: Mckenzie Young, female   DOB: 1916/02/21, 78 y.o.   MRN: [id number]  Subjective:    Patient ID: Mckenzie Young, female    DOB: [birth year], 78 y.o.   MRN: [id number]  HPI  F/u gout and anxiety, finger pain - resolved F/u ankle swelling and itching - resolved F/u HTN, poor balance  Wt Readings from Last 3 Encounters:  08/21/11 127 lb (57.607 kg)  04/17/11 130 lb (58.968 kg)  09/04/10 129 lb (58.514 kg)   BP Readings from Last 3 Encounters:  08/21/11 120/80  04/17/11 168/90  03/25/11 162/90      Review of Systems  Constitutional: Negative for chills, activity change, appetite change, fatigue and unexpected weight change.  HENT: Negative for congestion, mouth sores and sinus pressure.   Eyes: Negative for visual disturbance.  Respiratory: Negative for cough and chest tightness.   Gastrointestinal: Negative for nausea and abdominal pain.  Genitourinary: Negative for frequency, difficulty urinating and vaginal pain.  Musculoskeletal: Negative for back pain, arthralgias and gait problem.  Skin: Negative for pallor and rash.  Neurological: Negative for dizziness, tremors, weakness, numbness and headaches.  Psychiatric/Behavioral: Negative for confusion and sleep disturbance.       Objective:   Physical Exam  Constitutional: She appears well-developed and well-nourished. No distress.  HENT:  Head: Normocephalic.  Right Ear: External ear normal.  Left Ear: External ear normal.  Nose: Nose normal.  Mouth/Throat: Oropharynx is clear and moist.  Eyes: Conjunctivae are normal. Pupils are equal, round, and reactive to light. Right eye exhibits no discharge. Left eye exhibits no discharge.  Neck: Normal range of motion. Neck supple. No JVD present. No tracheal deviation present. No thyromegaly present.  Cardiovascular: Normal rate, regular rhythm and normal heart sounds.   Pulmonary/Chest: No stridor. No respiratory distress. She has no wheezes.  Abdominal: Soft. Bowel  sounds are normal. She exhibits no distension and no mass. There is no tenderness. There is no rebound and no guarding.  Musculoskeletal: She exhibits no edema and no tenderness.  Lymphadenopathy:    She has no cervical adenopathy.  Neurological: She displays normal reflexes. No cranial nerve deficit. She exhibits normal muscle tone. Coordination normal.  Skin: No rash noted. No erythema.  Psychiatric: She has a normal mood and affect. Her behavior is normal. Judgment and thought content normal.  Poor balance - better w/cane Lab Results  Component Value Date   WBC 6.1 09/23/2009   HGB 14.5 09/23/2009   HCT 42.5 09/23/2009   PLT 194.0 09/23/2009   GLUCOSE 103* 08/17/2011   ALT 18 09/23/2009   AST 18 09/23/2009   NA 141 08/17/2011   K 4.4 08/17/2011   CL 108 08/17/2011   CREATININE 1.7* 08/17/2011   BUN 32* 08/17/2011   CO2 27 08/17/2011   TSH 2.75 09/23/2009   HGBA1C 5.8 09/23/2009          Assessment & Plan:

## 2011-08-21 NOTE — Assessment & Plan Note (Addendum)
Multifctorial S/p PT eval - given a cane Better

## 2012-01-11 ENCOUNTER — Telehealth: Payer: Self-pay | Admitting: Internal Medicine

## 2012-01-11 MED ORDER — AMLODIPINE-OLMESARTAN 5-40 MG PO TABS: 1.0000 | ORAL_TABLET | Freq: Every day | ORAL | Status: DC

## 2012-01-11 NOTE — Telephone Encounter (Signed)
The pt called the triage line and is hoping to get a refill for a 90 day supply of Azor.  Thanks!

## 2012-01-11 NOTE — Telephone Encounter (Signed)
Called pt no answer LMOM [facility]... 01/11/12@11 :08am/LMB

## 2012-01-12 ENCOUNTER — Other Ambulatory Visit: Payer: Self-pay

## 2012-01-12 MED ORDER — AMLODIPINE-OLMESARTAN 5-40 MG PO TABS: 1.0000 | ORAL_TABLET | Freq: Every day | ORAL | Status: DC

## 2012-03-14 DIAGNOSIS — I10 Essential (primary) hypertension: Secondary | ICD-10-CM | POA: Insufficient documentation

## 2012-03-25 ENCOUNTER — Ambulatory Visit (INDEPENDENT_AMBULATORY_CARE_PROVIDER_SITE_OTHER): Payer: Medicare Other | Admitting: Internal Medicine

## 2012-03-25 ENCOUNTER — Other Ambulatory Visit (INDEPENDENT_AMBULATORY_CARE_PROVIDER_SITE_OTHER): Payer: Medicare Other

## 2012-03-25 ENCOUNTER — Encounter: Payer: Self-pay | Admitting: Internal Medicine

## 2012-03-25 VITALS — BP 148/80 | HR 88 | Temp 97.1°F | Resp 16 | Wt 124.0 lb

## 2012-03-25 DIAGNOSIS — N259 Disorder resulting from impaired renal tubular function, unspecified: Secondary | ICD-10-CM

## 2012-03-25 DIAGNOSIS — I10 Essential (primary) hypertension: Secondary | ICD-10-CM

## 2012-03-25 DIAGNOSIS — M1990 Unspecified osteoarthritis, unspecified site: Secondary | ICD-10-CM

## 2012-03-25 DIAGNOSIS — R269 Unspecified abnormalities of gait and mobility: Secondary | ICD-10-CM

## 2012-03-25 LAB — URINALYSIS, ROUTINE W REFLEX MICROSCOPIC
Bilirubin Urine: NEGATIVE
Nitrite: POSITIVE
Specific Gravity, Urine: 1.025 (ref 1.000–1.030)
pH: 5.5 (ref 5.0–8.0)

## 2012-03-25 LAB — BASIC METABOLIC PANEL
Calcium: 8.8 mg/dL (ref 8.4–10.5)
Creatinine, Ser: 2.1 mg/dL — ABNORMAL HIGH (ref 0.4–1.2)

## 2012-03-25 LAB — TSH: TSH: 2.54 u[IU]/mL (ref 0.35–5.50)

## 2012-03-25 LAB — URIC ACID: Uric Acid, Serum: 7.2 mg/dL — ABNORMAL HIGH (ref 2.4–7.0)

## 2012-03-25 LAB — HEPATIC FUNCTION PANEL: Total Bilirubin: 0.5 mg/dL (ref 0.3–1.2)

## 2012-03-25 MED ORDER — MELOXICAM 7.5 MG PO TABS: 7.5000 mg | ORAL_TABLET | Freq: Every day | ORAL | Status: DC

## 2012-03-25 MED ORDER — AMLODIPINE-OLMESARTAN 5-40 MG PO TABS: 1.0000 | ORAL_TABLET | Freq: Every day | ORAL | Status: DC

## 2012-03-25 NOTE — Assessment & Plan Note (Signed)
Continue with current prescription therapy as reflected on the Med list.  

## 2012-03-25 NOTE — Progress Notes (Signed)
   Subjective:    Patient ID: Mckenzie Young, female    DOB: 09/19/1915, 78 y.o.   MRN: [id number]  HPI  F/u gout and anxiety, finger pain - resolved F/u ankle swelling and itching - resolved F/u HTN, poor balance   Wt Readings from Last 3 Encounters:  03/25/12 124 lb (56.246 kg)  08/21/11 127 lb (57.607 kg)  04/17/11 130 lb (58.968 kg)   BP Readings from Last 3 Encounters:  03/25/12 148/80  08/21/11 120/80  04/17/11 168/90      Review of Systems  Constitutional: Negative for chills, activity change, appetite change, fatigue and unexpected weight change.  HENT: Negative for congestion, mouth sores and sinus pressure.   Eyes: Negative for visual disturbance.  Respiratory: Negative for cough and chest tightness.   Gastrointestinal: Negative for nausea and abdominal pain.  Genitourinary: Negative for frequency, difficulty urinating and vaginal pain.  Musculoskeletal: Negative for back pain, arthralgias and gait problem.  Skin: Negative for pallor and rash.  Neurological: Negative for dizziness, tremors, weakness, numbness and headaches.  Psychiatric/Behavioral: Negative for confusion and disturbed wake/sleep cycle.       Objective:   Physical Exam  Constitutional: She appears well-developed and well-nourished. No distress.  HENT:  Head: Normocephalic.  Right Ear: External ear normal.  Left Ear: External ear normal.  Nose: Nose normal.  Mouth/Throat: Oropharynx is clear and moist.  Eyes: Conjunctivae normal are normal. Pupils are equal, round, and reactive to light. Right eye exhibits no discharge. Left eye exhibits no discharge.  Neck: Normal range of motion. Neck supple. No JVD present. No tracheal deviation present. No thyromegaly present.  Cardiovascular: Normal rate, regular rhythm and normal heart sounds.   Pulmonary/Chest: No stridor. No respiratory distress. She has no wheezes.  Abdominal: Soft. Bowel sounds are normal. She exhibits no distension and no mass.  There is no tenderness. There is no rebound and no guarding.  Musculoskeletal: She exhibits no edema and no tenderness.  Lymphadenopathy:    She has no cervical adenopathy.  Neurological: She displays normal reflexes. No cranial nerve deficit. She exhibits normal muscle tone. Coordination normal.  Skin: No rash noted. No erythema.  Psychiatric: She has a normal mood and affect. Her behavior is normal. Judgment and thought content normal.   Poor balance - better     Lab Results  Component Value Date   WBC 6.1 09/23/2009   HGB 14.5 09/23/2009   HCT 42.5 09/23/2009   PLT 194.0 09/23/2009   GLUCOSE 103* 08/17/2011   ALT 18 09/23/2009   AST 18 09/23/2009   NA 141 08/17/2011   K 4.4 08/17/2011   CL 108 08/17/2011   CREATININE 1.7* 08/17/2011   BUN 32* 08/17/2011   CO2 27 08/17/2011   TSH 2.75 09/23/2009   HGBA1C 5.8 09/23/2009          Assessment & Plan:

## 2012-03-25 NOTE — Assessment & Plan Note (Signed)
Walker

## 2012-03-29 ENCOUNTER — Other Ambulatory Visit: Payer: Self-pay | Admitting: Internal Medicine

## 2012-03-29 NOTE — Telephone Encounter (Signed)
Mckenzie Young, please, inform patient that all labs are normal except for  1. Elevated kidney function test - chronic - a little worse -drink more water! - labs in 2 mo: BMET 2. Bladder infection -Keflex 500 mg bid  Thx

## 2012-03-29 NOTE — Telephone Encounter (Signed)
Ok to Rf? 

## 2012-04-07 MED ORDER — CEPHALEXIN 500 MG PO CAPS: 500.0000 mg | ORAL_CAPSULE | Freq: Two times a day (BID) | ORAL | Status: DC

## 2012-04-08 MED ORDER — CEPHALEXIN 500 MG PO CAPS: 500.0000 mg | ORAL_CAPSULE | Freq: Two times a day (BID) | ORAL | Status: DC

## 2012-04-08 NOTE — Addendum Note (Signed)
Addended by: Deatra James on: 04/08/2012 10:07 AM   Modules accepted: Orders

## 2012-04-08 NOTE — Telephone Encounter (Signed)
Called pt to clarify if she received response concerning labs, [place] Gave md response concerning labs [facility].../lmb

## 2012-05-23 ENCOUNTER — Other Ambulatory Visit: Payer: Self-pay | Admitting: *Deleted

## 2012-05-23 MED ORDER — AMLODIPINE-OLMESARTAN 5-40 MG PO TABS: 1.0000 | ORAL_TABLET | Freq: Every day | ORAL | Status: DC

## 2012-05-23 NOTE — Telephone Encounter (Signed)
Daughter of patient called reqeust we call [facility] to order her mom blood pressure medication AZOR 5-40mg . Stated they do not have in their system per [facility]. Sent Rx vis [facility] and called [facility] with order. Patient daughter request samples of AZOR till could get from [facility]. Daughter to pick up samples today.

## 2012-07-13 SURGERY — Surgical Case
Anesthesia: *Unknown

## 2012-09-23 ENCOUNTER — Ambulatory Visit (INDEPENDENT_AMBULATORY_CARE_PROVIDER_SITE_OTHER): Payer: Medicare Other | Admitting: Internal Medicine

## 2012-09-23 ENCOUNTER — Other Ambulatory Visit: Payer: Self-pay | Admitting: Internal Medicine

## 2012-09-23 ENCOUNTER — Encounter: Payer: Self-pay | Admitting: Internal Medicine

## 2012-09-23 ENCOUNTER — Other Ambulatory Visit (INDEPENDENT_AMBULATORY_CARE_PROVIDER_SITE_OTHER): Payer: Medicare Other

## 2012-09-23 VITALS — BP 120/80 | HR 64 | Temp 96.7°F | Resp 16 | Wt 123.0 lb

## 2012-09-23 DIAGNOSIS — M109 Gout, unspecified: Secondary | ICD-10-CM

## 2012-09-23 DIAGNOSIS — I10 Essential (primary) hypertension: Secondary | ICD-10-CM

## 2012-09-23 DIAGNOSIS — R269 Unspecified abnormalities of gait and mobility: Secondary | ICD-10-CM

## 2012-09-23 DIAGNOSIS — R202 Paresthesia of skin: Secondary | ICD-10-CM | POA: Insufficient documentation

## 2012-09-23 DIAGNOSIS — R209 Unspecified disturbances of skin sensation: Secondary | ICD-10-CM

## 2012-09-23 DIAGNOSIS — N259 Disorder resulting from impaired renal tubular function, unspecified: Secondary | ICD-10-CM

## 2012-09-23 DIAGNOSIS — E538 Deficiency of other specified B group vitamins: Secondary | ICD-10-CM

## 2012-09-23 LAB — BASIC METABOLIC PANEL
BUN: 33 mg/dL — ABNORMAL HIGH (ref 6–23)
CO2: 25 mEq/L (ref 19–32)
Chloride: 107 mEq/L (ref 96–112)
GFR: 26.42 mL/min — ABNORMAL LOW (ref >60.00)
Glucose, Bld: 123 mg/dL — ABNORMAL HIGH (ref 70–99)
Potassium: 3.9 mEq/L (ref 3.5–5.1)
Sodium: 139 mEq/L (ref 135–145)

## 2012-09-23 LAB — VITAMIN B12: Vitamin B-12: 220 pg/mL (ref 211–911)

## 2012-09-23 MED ORDER — AMLODIPINE-OLMESARTAN 5-40 MG PO TABS: 1.0000 | ORAL_TABLET | Freq: Every day | ORAL | Status: DC

## 2012-09-23 MED ORDER — VITAMIN B-12 500 MCG SL SUBL: SUBLINGUAL_TABLET | SUBLINGUAL | Status: DC

## 2012-09-23 NOTE — Assessment & Plan Note (Signed)
B12 test 

## 2012-09-23 NOTE — Assessment & Plan Note (Signed)
No relapse 

## 2012-09-23 NOTE — Assessment & Plan Note (Signed)
Continue with current prescription therapy as reflected on the Med list.  

## 2012-09-23 NOTE — Assessment & Plan Note (Signed)
Start SL B12 

## 2012-09-23 NOTE — Assessment & Plan Note (Signed)
Labs

## 2012-09-23 NOTE — Assessment & Plan Note (Signed)
Multifctorial S/p PT eval - given a cane; walker

## 2012-09-23 NOTE — Assessment & Plan Note (Signed)
BMET 

## 2012-09-23 NOTE — Progress Notes (Signed)
   Subjective:    HPI  F/u gout and anxiety, finger pain - resolved F/u ankle swelling and itching - resolved F/u HTN, poor balance   Wt Readings from Last 3 Encounters:  09/23/12 123 lb (55.792 kg)  03/25/12 124 lb (56.246 kg)  08/21/11 127 lb (57.607 kg)   BP Readings from Last 3 Encounters:  09/23/12 120/80  03/25/12 148/80  08/21/11 120/80      Review of Systems  Constitutional: Negative for chills, activity change, appetite change, fatigue and unexpected weight change.  HENT: Negative for congestion, mouth sores and sinus pressure.   Eyes: Negative for visual disturbance.  Respiratory: Negative for cough and chest tightness.   Gastrointestinal: Negative for nausea and abdominal pain.  Genitourinary: Negative for frequency, difficulty urinating and vaginal pain.  Musculoskeletal: Negative for back pain, arthralgias and gait problem.  Skin: Negative for pallor and rash.  Neurological: Negative for dizziness, tremors, weakness, numbness and headaches.  Psychiatric/Behavioral: Negative for confusion and sleep disturbance.       Objective:   Physical Exam  Constitutional: She appears well-developed and well-nourished. No distress.  HENT:  Head: Normocephalic.  Right Ear: External ear normal.  Left Ear: External ear normal.  Nose: Nose normal.  Mouth/Throat: Oropharynx is clear and moist.  Eyes: Conjunctivae are normal. Pupils are equal, round, and reactive to light. Right eye exhibits no discharge. Left eye exhibits no discharge.  Neck: Normal range of motion. Neck supple. No JVD present. No tracheal deviation present. No thyromegaly present.  Cardiovascular: Normal rate, regular rhythm and normal heart sounds.   Pulmonary/Chest: No stridor. No respiratory distress. She has no wheezes.  Abdominal: Soft. Bowel sounds are normal. She exhibits no distension and no mass. There is no tenderness. There is no rebound and no guarding.  Musculoskeletal: She exhibits no edema  and no tenderness.  Lymphadenopathy:    She has no cervical adenopathy.  Neurological: She displays normal reflexes. No cranial nerve deficit. She exhibits normal muscle tone. Coordination normal.  Skin: No rash noted. No erythema.  Psychiatric: She has a normal mood and affect. Her behavior is normal. Judgment and thought content normal.   Poor balance - better w/a walker    Lab Results  Component Value Date   WBC 6.1 09/23/2009   HGB 14.5 09/23/2009   HCT 42.5 09/23/2009   PLT 194.0 09/23/2009   GLUCOSE 79 03/25/2012   ALT 9 03/25/2012   AST 17 03/25/2012   NA 141 03/25/2012   K 4.6 03/25/2012   CL 109 03/25/2012   CREATININE 2.1* 03/25/2012   BUN 33* 03/25/2012   CO2 24 03/25/2012   TSH 2.54 03/25/2012   HGBA1C 5.8 09/23/2009          Assessment & Plan:

## 2013-01-04 ENCOUNTER — Other Ambulatory Visit: Payer: Self-pay

## 2013-04-06 ENCOUNTER — Other Ambulatory Visit: Payer: Self-pay

## 2013-04-12 ENCOUNTER — Encounter: Payer: Self-pay | Admitting: Internal Medicine

## 2013-04-12 ENCOUNTER — Ambulatory Visit (INDEPENDENT_AMBULATORY_CARE_PROVIDER_SITE_OTHER): Payer: Medicare Other | Admitting: Internal Medicine

## 2013-04-12 VITALS — BP 140/70 | HR 68 | Temp 97.6°F | Resp 16 | Wt 125.0 lb

## 2013-04-12 DIAGNOSIS — I10 Essential (primary) hypertension: Secondary | ICD-10-CM

## 2013-04-12 DIAGNOSIS — E538 Deficiency of other specified B group vitamins: Secondary | ICD-10-CM

## 2013-04-12 DIAGNOSIS — M109 Gout, unspecified: Secondary | ICD-10-CM

## 2013-04-12 DIAGNOSIS — R739 Hyperglycemia, unspecified: Secondary | ICD-10-CM

## 2013-04-12 DIAGNOSIS — N259 Disorder resulting from impaired renal tubular function, unspecified: Secondary | ICD-10-CM

## 2013-04-12 DIAGNOSIS — R269 Unspecified abnormalities of gait and mobility: Secondary | ICD-10-CM

## 2013-04-12 DIAGNOSIS — R7309 Other abnormal glucose: Secondary | ICD-10-CM

## 2013-04-12 MED ORDER — AMLODIPINE-OLMESARTAN 5-40 MG PO TABS: 1.0000 | ORAL_TABLET | Freq: Every day | ORAL | Status: DC

## 2013-04-12 NOTE — Assessment & Plan Note (Signed)
Continue with current prescription therapy as reflected on the Med list.  

## 2013-04-12 NOTE — Progress Notes (Signed)
Pre visit review using our clinic review tool, if applicable. No additional management support is needed unless otherwise documented below in the visit note. 

## 2013-04-12 NOTE — Assessment & Plan Note (Signed)
Continue with current prescription therapy as reflected on the Med list. Declined labs 

## 2013-04-12 NOTE — Progress Notes (Signed)
   Subjective:    HPI   F/u gout and anxiety F/u ankle swelling and itching - resolved F/u HTN, poor balance   Wt Readings from Last 3 Encounters:  04/12/13 125 lb (56.7 kg)  09/23/12 123 lb (55.792 kg)  03/25/12 124 lb (56.246 kg)   BP Readings from Last 3 Encounters:  04/12/13 140/70  09/23/12 120/80  03/25/12 148/80      Review of Systems  Constitutional: Negative for chills, activity change, appetite change, fatigue and unexpected weight change.  HENT: Negative for congestion, mouth sores and sinus pressure.   Eyes: Negative for visual disturbance.  Respiratory: Negative for cough and chest tightness.   Gastrointestinal: Negative for nausea and abdominal pain.  Genitourinary: Negative for frequency, difficulty urinating and vaginal pain.  Musculoskeletal: Negative for arthralgias, back pain and gait problem.  Skin: Negative for pallor and rash.  Neurological: Negative for dizziness, tremors, weakness, numbness and headaches.  Psychiatric/Behavioral: Negative for confusion and sleep disturbance.       Objective:   Physical Exam  Constitutional: She appears well-developed and well-nourished. No distress.  HENT:  Head: Normocephalic.  Right Ear: External ear normal.  Left Ear: External ear normal.  Nose: Nose normal.  Mouth/Throat: Oropharynx is clear and moist.  Eyes: Conjunctivae are normal. Pupils are equal, round, and reactive to light. Right eye exhibits no discharge. Left eye exhibits no discharge.  Neck: Normal range of motion. Neck supple. No JVD present. No tracheal deviation present. No thyromegaly present.  Cardiovascular: Normal rate, regular rhythm and normal heart sounds.   Pulmonary/Chest: No stridor. No respiratory distress. She has no wheezes.  Abdominal: Soft. Bowel sounds are normal. She exhibits no distension and no mass. There is no tenderness. There is no rebound and no guarding.  Musculoskeletal: She exhibits no edema and no tenderness.   Lymphadenopathy:    She has no cervical adenopathy.  Neurological: She displays normal reflexes. No cranial nerve deficit. She exhibits normal muscle tone. Coordination normal.  Skin: No rash noted. No erythema.  Psychiatric: She has a normal mood and affect. Her behavior is normal. Judgment and thought content normal.   Poor balance - better w/a walker    Lab Results  Component Value Date   WBC 6.1 09/23/2009   HGB 14.5 09/23/2009   HCT 42.5 09/23/2009   PLT 194.0 09/23/2009   GLUCOSE 123* 09/23/2012   ALT 9 03/25/2012   AST 17 03/25/2012   NA 139 09/23/2012   K 3.9 09/23/2012   CL 107 09/23/2012   CREATININE 1.9* 09/23/2012   BUN 33* 09/23/2012   CO2 25 09/23/2012   TSH 2.54 03/25/2012   HGBA1C 5.8 09/23/2009          Assessment & Plan:

## 2013-04-12 NOTE — Assessment & Plan Note (Addendum)
Continue with current prescription therapy as reflected on the Med list. Declined labs

## 2013-04-12 NOTE — Assessment & Plan Note (Signed)
Walker

## 2013-04-21 ENCOUNTER — Ambulatory Visit: Payer: Medicare Other | Admitting: Internal Medicine

## 2013-05-17 DIAGNOSIS — Z947 Corneal transplant status: Secondary | ICD-10-CM | POA: Insufficient documentation

## 2013-06-14 ENCOUNTER — Telehealth: Payer: Self-pay | Admitting: *Deleted

## 2013-06-14 NOTE — Telephone Encounter (Signed)
Rec fax stating Azor is non formulary on member's plan. Would Benicar, Candesartan, Cilexetil, Diovan, Eprosartan mesylate, Irbesartan, Losartan pot, Telmisartan, Valsartan, Exforge HCT, Exforge, Losartan Pot, Atacand Hct, Benicar HCT, Diovan HCT, Telmisartan HCT OR Telmisartan-amlodipine be as effective for pt?

## 2013-06-14 NOTE — Telephone Encounter (Signed)
please ask the pt if she is willing to try a generic Rx or something similar Thx

## 2013-06-15 MED ORDER — AMLODIPINE BESYLATE-VALSARTAN 5-320 MG PO TABS: 1.0000 | ORAL_TABLET | Freq: Every day | ORAL | Status: DC

## 2013-06-15 NOTE — Telephone Encounter (Signed)
Ok Exforge instead Thx

## 2013-06-15 NOTE — Telephone Encounter (Signed)
Called pt- no answer/unable to leave vm. Will try again later.  

## 2013-06-15 NOTE — Telephone Encounter (Signed)
Pt informed. [facility] now [facility]. Rx sent there.   [phone]

## 2013-06-15 NOTE — Telephone Encounter (Signed)
Spoke with pt she states she is willing to try a different Rx that is covered by insurance

## 2013-06-16 ENCOUNTER — Telehealth: Payer: Self-pay | Admitting: Internal Medicine

## 2013-06-16 NOTE — Telephone Encounter (Signed)
Pt's daughter call concern about Mckenzie Young's medication. Please call pt

## 2013-06-16 NOTE — Telephone Encounter (Signed)
Called pt- No answer. Unable to reach daughter Clenton Paredele. I need to know which pharmacy to send Exforge rx to. [facility] was [facility]. Pt was informed of this on 06/15/13.

## 2013-06-16 NOTE — Telephone Encounter (Signed)
[employer] Rep called to see if fax was received stated it was faxed on 14th & 15th, he stated it was a questionaire regarding the Azor medication and it needs to be faxed back  Please advise if this has been received

## 2013-06-19 ENCOUNTER — Telehealth: Payer: Self-pay | Admitting: Internal Medicine

## 2013-06-19 NOTE — Telephone Encounter (Signed)
Patients daughter is concerned about mothers blood pressure meds.  Mckenzie Young does not want to cover azor.  Patients daughter said she does best on the Azor.  [employer]'s pre certification number is [phone]. Patients membership number is [email or web address].  Fax# [phone].  Patients daughter said a previous med that was tried caused gout but she is not sure which med.  Mckenzie Young is requesting a call with that statement.   Mckenzie Young also told her that they would approve amlodipine or valsar, but daughter would rather her stay on the azor if that could get approved.  Also is requesting samples until meds can get approved.

## 2013-06-21 NOTE — Telephone Encounter (Signed)
I called Mckenzie Young and spoke to a Pharmacologistpharmacy technician. She transferred me to denial dept @ [phone] option 2. PA was already denied on 06/14/13 because a physician necessity statement wasn't received. Appeals process/form @ [email or web address] to print form to submit via fax. Form printed/will give to MD for completion/signature.

## 2013-06-21 NOTE — Telephone Encounter (Signed)
Pt called back request for the assistant to check to see if Exforge was the medication that she was on before Azor. If it is Exforge pt does not want this med because it gave her gout. Please check.

## 2013-06-22 NOTE — Telephone Encounter (Signed)
Exforge does not cause gout Thx

## 2013-06-22 NOTE — Telephone Encounter (Signed)
Exforge was already sent to mail order pharmacy. Pt does not want Exforge due to gout when previously tried and d/c 03/25/11. Please advise.

## 2013-06-26 NOTE — Telephone Encounter (Signed)
Pt informed

## 2013-07-05 ENCOUNTER — Ambulatory Visit (INDEPENDENT_AMBULATORY_CARE_PROVIDER_SITE_OTHER): Payer: Managed Care, Other (non HMO) | Admitting: Internal Medicine

## 2013-07-05 ENCOUNTER — Encounter: Payer: Self-pay | Admitting: Internal Medicine

## 2013-07-05 VITALS — BP 130/80 | HR 76 | Temp 96.7°F | Resp 16 | Wt 122.0 lb

## 2013-07-05 DIAGNOSIS — N259 Disorder resulting from impaired renal tubular function, unspecified: Secondary | ICD-10-CM

## 2013-07-05 DIAGNOSIS — I10 Essential (primary) hypertension: Secondary | ICD-10-CM

## 2013-07-05 DIAGNOSIS — E538 Deficiency of other specified B group vitamins: Secondary | ICD-10-CM

## 2013-07-05 MED ORDER — AMLODIPINE-OLMESARTAN 5-40 MG PO TABS: 1.0000 | ORAL_TABLET | Freq: Every day | ORAL | Status: DC

## 2013-07-05 NOTE — Progress Notes (Signed)
Pre visit review using our clinic review tool, if applicable. No additional management support is needed unless otherwise documented below in the visit note. 

## 2013-07-05 NOTE — Progress Notes (Signed)
   Subjective:    HPI  C/o generic Exforge is too big - hard to swallow -- needs brand Exforge F/u gout and anxiety F/u ankle swelling and itching - resolved F/u HTN, poor balance   Wt Readings from Last 3 Encounters:  07/05/13 122 lb (55.339 kg)  04/12/13 125 lb (56.7 kg)  09/23/12 123 lb (55.792 kg)   BP Readings from Last 3 Encounters:  07/05/13 130/80  04/12/13 140/70  09/23/12 120/80      Review of Systems  Constitutional: Negative for chills, activity change, appetite change, fatigue and unexpected weight change.  HENT: Negative for congestion, mouth sores and sinus pressure.   Eyes: Negative for visual disturbance.  Respiratory: Negative for cough and chest tightness.   Gastrointestinal: Negative for nausea and abdominal pain.  Genitourinary: Negative for frequency, difficulty urinating and vaginal pain.  Musculoskeletal: Negative for arthralgias, back pain and gait problem.  Skin: Negative for pallor and rash.  Neurological: Negative for dizziness, tremors, weakness, numbness and headaches.  Psychiatric/Behavioral: Negative for confusion and sleep disturbance.       Objective:   Physical Exam  Constitutional: She appears well-developed and well-nourished. No distress.  HENT:  Head: Normocephalic.  Right Ear: External ear normal.  Left Ear: External ear normal.  Nose: Nose normal.  Mouth/Throat: Oropharynx is clear and moist.  Eyes: Conjunctivae are normal. Pupils are equal, round, and reactive to light. Right eye exhibits no discharge. Left eye exhibits no discharge.  Neck: Normal range of motion. Neck supple. No JVD present. No tracheal deviation present. No thyromegaly present.  Cardiovascular: Normal rate, regular rhythm and normal heart sounds.   Pulmonary/Chest: No stridor. No respiratory distress. She has no wheezes.  Abdominal: Soft. Bowel sounds are normal. She exhibits no distension and no mass. There is no tenderness. There is no rebound and no  guarding.  Musculoskeletal: She exhibits no edema and no tenderness.  Lymphadenopathy:    She has no cervical adenopathy.  Neurological: She displays normal reflexes. No cranial nerve deficit. She exhibits normal muscle tone. Coordination normal.  Skin: No rash noted. No erythema.  Psychiatric: She has a normal mood and affect. Her behavior is normal. Judgment and thought content normal.   Poor balance - better w/a walker    Lab Results  Component Value Date   WBC 6.1 09/23/2009   HGB 14.5 09/23/2009   HCT 42.5 09/23/2009   PLT 194.0 09/23/2009   GLUCOSE 123* 09/23/2012   ALT 9 03/25/2012   AST 17 03/25/2012   NA 139 09/23/2012   K 3.9 09/23/2012   CL 107 09/23/2012   CREATININE 1.9* 09/23/2012   BUN 33* 09/23/2012   CO2 25 09/23/2012   TSH 2.54 03/25/2012   HGBA1C 5.8 09/23/2009          Assessment & Plan:

## 2013-07-06 NOTE — Assessment & Plan Note (Signed)
Continue with current prescription therapy as reflected on the Med list.  

## 2013-07-06 NOTE — Assessment & Plan Note (Signed)
BP is better

## 2013-07-06 NOTE — Assessment & Plan Note (Signed)
Labs periodically 

## 2013-10-10 ENCOUNTER — Encounter: Payer: Self-pay | Admitting: Internal Medicine

## 2013-10-10 ENCOUNTER — Other Ambulatory Visit (INDEPENDENT_AMBULATORY_CARE_PROVIDER_SITE_OTHER): Payer: Managed Care, Other (non HMO)

## 2013-10-10 ENCOUNTER — Ambulatory Visit (INDEPENDENT_AMBULATORY_CARE_PROVIDER_SITE_OTHER): Payer: Managed Care, Other (non HMO) | Admitting: Internal Medicine

## 2013-10-10 VITALS — BP 140/88 | HR 80 | Temp 96.9°F | Resp 16 | Wt 123.0 lb

## 2013-10-10 DIAGNOSIS — R269 Unspecified abnormalities of gait and mobility: Secondary | ICD-10-CM

## 2013-10-10 DIAGNOSIS — E538 Deficiency of other specified B group vitamins: Secondary | ICD-10-CM

## 2013-10-10 DIAGNOSIS — R7309 Other abnormal glucose: Secondary | ICD-10-CM

## 2013-10-10 DIAGNOSIS — I10 Essential (primary) hypertension: Secondary | ICD-10-CM

## 2013-10-10 DIAGNOSIS — R739 Hyperglycemia, unspecified: Secondary | ICD-10-CM

## 2013-10-10 DIAGNOSIS — M109 Gout, unspecified: Secondary | ICD-10-CM

## 2013-10-10 DIAGNOSIS — N259 Disorder resulting from impaired renal tubular function, unspecified: Secondary | ICD-10-CM

## 2013-10-10 LAB — CBC WITH DIFFERENTIAL/PLATELET
BASOS PCT: 1 % (ref 0.0–3.0)
Basophils Absolute: 0.1 10*3/uL (ref 0.0–0.1)
EOS PCT: 3.4 % (ref 0.0–5.0)
Eosinophils Absolute: 0.2 10*3/uL (ref 0.0–0.7)
HCT: 43.5 % (ref 36.0–46.0)
HEMOGLOBIN: 14.3 g/dL (ref 12.0–15.0)
LYMPHS ABS: 2 10*3/uL (ref 0.7–4.0)
Lymphocytes Relative: 31.7 % (ref 12.0–46.0)
MCHC: 32.9 g/dL (ref 30.0–36.0)
MCV: 87.4 fl (ref 78.0–100.0)
MONO ABS: 0.5 10*3/uL (ref 0.1–1.0)
Monocytes Relative: 8.2 % (ref 3.0–12.0)
NEUTROS ABS: 3.5 10*3/uL (ref 1.4–7.7)
Neutrophils Relative %: 55.7 % (ref 43.0–77.0)
Platelets: 202 10*3/uL (ref 150.0–400.0)
RBC: 4.98 Mil/uL (ref 3.87–5.11)
RDW: 14.1 % (ref 11.5–15.5)
WBC: 6.2 10*3/uL (ref 4.0–10.5)

## 2013-10-10 LAB — TSH: TSH: 2.29 u[IU]/mL (ref 0.35–4.50)

## 2013-10-10 LAB — URINALYSIS, ROUTINE W REFLEX MICROSCOPIC
Bilirubin Urine: NEGATIVE
Hgb urine dipstick: NEGATIVE
KETONES UR: NEGATIVE
Nitrite: NEGATIVE
Specific Gravity, Urine: 1.02 (ref 1.000–1.030)
TOTAL PROTEIN, URINE-UPE24: NEGATIVE
Urine Glucose: NEGATIVE
Urobilinogen, UA: 0.2 (ref 0.0–1.0)
pH: 5.5 (ref 5.0–8.0)

## 2013-10-10 LAB — BASIC METABOLIC PANEL
BUN: 24 mg/dL — AB (ref 6–23)
CO2: 25 mEq/L (ref 19–32)
Calcium: 9.1 mg/dL (ref 8.4–10.5)
Chloride: 108 mEq/L (ref 96–112)
Creatinine, Ser: 1.5 mg/dL — ABNORMAL HIGH (ref 0.4–1.2)
GFR: 35.58 mL/min — ABNORMAL LOW (ref >60.00)
Glucose, Bld: 93 mg/dL (ref 70–99)
POTASSIUM: 4.2 meq/L (ref 3.5–5.1)
Sodium: 140 mEq/L (ref 135–145)

## 2013-10-10 LAB — HEPATIC FUNCTION PANEL
ALT: 11 U/L (ref 0–35)
AST: 22 U/L (ref 0–37)
Albumin: 3.7 g/dL (ref 3.5–5.2)
Alkaline Phosphatase: 80 U/L (ref 39–117)
BILIRUBIN TOTAL: 0.5 mg/dL (ref 0.2–1.2)
Bilirubin, Direct: 0.1 mg/dL (ref 0.0–0.3)
TOTAL PROTEIN: 6.6 g/dL (ref 6.0–8.3)

## 2013-10-10 LAB — LIPID PANEL
CHOLESTEROL: 214 mg/dL — AB (ref 0–200)
HDL: 46.1 mg/dL (ref >39.00)
LDL Cholesterol: 119 mg/dL — ABNORMAL HIGH (ref 0–99)
TRIGLYCERIDES: 246 mg/dL — AB (ref 0.0–149.0)
Total CHOL/HDL Ratio: 5
VLDL: 49.2 mg/dL — ABNORMAL HIGH (ref 0.0–40.0)

## 2013-10-10 LAB — HEMOGLOBIN A1C: Hgb A1c MFr Bld: 5.6 % (ref 4.6–6.5)

## 2013-10-10 LAB — VITAMIN B12: Vitamin B-12: 805 pg/mL (ref 211–911)

## 2013-10-10 LAB — URIC ACID: Uric Acid, Serum: 6.6 mg/dL (ref 2.4–7.0)

## 2013-10-10 MED ORDER — AMLODIPINE-OLMESARTAN 5-40 MG PO TABS: 1.0000 | ORAL_TABLET | Freq: Every day | ORAL | Status: DC

## 2013-10-10 NOTE — Assessment & Plan Note (Signed)
Continue with current prescription therapy as reflected on the Med list.  

## 2013-10-10 NOTE — Progress Notes (Signed)
Pre visit review using our clinic review tool, if applicable. No additional management support is needed unless otherwise documented below in the visit note. 

## 2013-10-10 NOTE — Progress Notes (Signed)
   Subjective:    HPI  F/u generic Exforge is too big - hard to swallow -- using a brand Exforge F/u gout and anxiety F/u ankle swelling and itching - resolved F/u HTN, poor balance   Wt Readings from Last 3 Encounters:  10/10/13 123 lb (55.792 kg)  07/05/13 122 lb (55.339 kg)  04/12/13 125 lb (56.7 kg)   BP Readings from Last 3 Encounters:  10/10/13 140/88  07/05/13 130/80  04/12/13 140/70      Review of Systems  Constitutional: Negative for chills, activity change, appetite change, fatigue and unexpected weight change.  HENT: Negative for congestion, mouth sores and sinus pressure.   Eyes: Negative for visual disturbance.  Respiratory: Negative for cough and chest tightness.   Gastrointestinal: Negative for nausea and abdominal pain.  Genitourinary: Negative for frequency, difficulty urinating and vaginal pain.  Musculoskeletal: Negative for arthralgias, back pain and gait problem.  Skin: Negative for pallor and rash.  Neurological: Negative for dizziness, tremors, weakness, numbness and headaches.  Psychiatric/Behavioral: Negative for confusion and sleep disturbance.       Objective:   Physical Exam  Constitutional: She appears well-developed and well-nourished. No distress.  HENT:  Head: Normocephalic.  Right Ear: External ear normal.  Left Ear: External ear normal.  Nose: Nose normal.  Mouth/Throat: Oropharynx is clear and moist.  Eyes: Conjunctivae are normal. Pupils are equal, round, and reactive to light. Right eye exhibits no discharge. Left eye exhibits no discharge.  Neck: Normal range of motion. Neck supple. No JVD present. No tracheal deviation present. No thyromegaly present.  Cardiovascular: Normal rate, regular rhythm and normal heart sounds.   Pulmonary/Chest: No stridor. No respiratory distress. She has no wheezes.  Abdominal: Soft. Bowel sounds are normal. She exhibits no distension and no mass. There is no tenderness. There is no rebound and no  guarding.  Musculoskeletal: She exhibits no edema and no tenderness.  Lymphadenopathy:    She has no cervical adenopathy.  Neurological: She displays normal reflexes. No cranial nerve deficit. She exhibits normal muscle tone. Coordination normal.  Skin: No rash noted. No erythema.  Psychiatric: She has a normal mood and affect. Her behavior is normal. Judgment and thought content normal.   Poor balance - better w/a walker    Lab Results  Component Value Date   WBC 6.1 09/23/2009   HGB 14.5 09/23/2009   HCT 42.5 09/23/2009   PLT 194.0 09/23/2009   GLUCOSE 123* 09/23/2012   ALT 9 03/25/2012   AST 17 03/25/2012   NA 139 09/23/2012   K 3.9 09/23/2012   CL 107 09/23/2012   CREATININE 1.9* 09/23/2012   BUN 33* 09/23/2012   CO2 25 09/23/2012   TSH 2.54 03/25/2012   HGBA1C 5.8 09/23/2009          Assessment & Plan:

## 2013-10-10 NOTE — Assessment & Plan Note (Signed)
Labs

## 2013-10-11 ENCOUNTER — Telehealth: Payer: Self-pay | Admitting: Internal Medicine

## 2013-10-11 NOTE — Telephone Encounter (Signed)
Relevant patient education assigned to patient using Emmi. ° °

## 2014-01-17 ENCOUNTER — Telehealth: Payer: Self-pay | Admitting: Internal Medicine

## 2014-01-17 NOTE — Telephone Encounter (Signed)
Kim with [employer] is calling to inquire if there has been a change in the patient's amLODipine-valsartan (EXFORGE) 5-320 MG medication. Please advise.

## 2014-01-17 NOTE — Telephone Encounter (Signed)
Called Kim back no answer LMOM yes md change med @ last ov 09/2013 to generic azor 5/40 mg. Pt was complaining the exforge was too bid to swallow...Raechel Chute/lmb

## 2014-04-19 ENCOUNTER — Encounter: Payer: Self-pay | Admitting: Internal Medicine

## 2014-04-19 ENCOUNTER — Other Ambulatory Visit (INDEPENDENT_AMBULATORY_CARE_PROVIDER_SITE_OTHER): Payer: Managed Care, Other (non HMO)

## 2014-04-19 ENCOUNTER — Ambulatory Visit (INDEPENDENT_AMBULATORY_CARE_PROVIDER_SITE_OTHER): Payer: Managed Care, Other (non HMO) | Admitting: Internal Medicine

## 2014-04-19 VITALS — BP 110/82 | HR 92 | Temp 98.2°F | Wt 120.0 lb

## 2014-04-19 DIAGNOSIS — I10 Essential (primary) hypertension: Secondary | ICD-10-CM

## 2014-04-19 DIAGNOSIS — N259 Disorder resulting from impaired renal tubular function, unspecified: Secondary | ICD-10-CM

## 2014-04-19 DIAGNOSIS — E538 Deficiency of other specified B group vitamins: Secondary | ICD-10-CM

## 2014-04-19 DIAGNOSIS — M1000 Idiopathic gout, unspecified site: Secondary | ICD-10-CM

## 2014-04-19 LAB — BASIC METABOLIC PANEL
BUN: 25 mg/dL — ABNORMAL HIGH (ref 6–23)
CHLORIDE: 108 meq/L (ref 96–112)
CO2: 26 mEq/L (ref 19–32)
Calcium: 8.7 mg/dL (ref 8.4–10.5)
Creatinine, Ser: 1.7 mg/dL — ABNORMAL HIGH (ref 0.4–1.2)
GFR: 30.19 mL/min — ABNORMAL LOW (ref >60.00)
Glucose, Bld: 116 mg/dL — ABNORMAL HIGH (ref 70–99)
Potassium: 4.5 mEq/L (ref 3.5–5.1)
Sodium: 141 mEq/L (ref 135–145)

## 2014-04-19 MED ORDER — AMLODIPINE-OLMESARTAN 5-40 MG PO TABS: 1.0000 | ORAL_TABLET | Freq: Every day | ORAL | Status: DC

## 2014-04-19 NOTE — Assessment & Plan Note (Signed)
No relapse off HCTZ

## 2014-04-19 NOTE — Assessment & Plan Note (Signed)
Continue with current prescription therapy as reflected on the Med list.  

## 2014-04-19 NOTE — Progress Notes (Signed)
Pre visit review using our clinic review tool, if applicable. No additional management support is needed unless otherwise documented below in the visit note. 

## 2014-04-19 NOTE — Assessment & Plan Note (Signed)
Labs

## 2014-10-17 ENCOUNTER — Other Ambulatory Visit (INDEPENDENT_AMBULATORY_CARE_PROVIDER_SITE_OTHER): Payer: Managed Care, Other (non HMO)

## 2014-10-17 ENCOUNTER — Ambulatory Visit (INDEPENDENT_AMBULATORY_CARE_PROVIDER_SITE_OTHER): Payer: Managed Care, Other (non HMO) | Admitting: Internal Medicine

## 2014-10-17 ENCOUNTER — Encounter: Payer: Self-pay | Admitting: Internal Medicine

## 2014-10-17 VITALS — BP 118/78 | HR 78 | Wt 115.0 lb

## 2014-10-17 DIAGNOSIS — N259 Disorder resulting from impaired renal tubular function, unspecified: Secondary | ICD-10-CM

## 2014-10-17 DIAGNOSIS — R269 Unspecified abnormalities of gait and mobility: Secondary | ICD-10-CM

## 2014-10-17 DIAGNOSIS — E538 Deficiency of other specified B group vitamins: Secondary | ICD-10-CM

## 2014-10-17 DIAGNOSIS — I10 Essential (primary) hypertension: Secondary | ICD-10-CM | POA: Diagnosis not present

## 2014-10-17 LAB — HEPATIC FUNCTION PANEL
ALK PHOS: 89 U/L (ref 39–117)
ALT: 8 U/L (ref 0–35)
AST: 16 U/L (ref 0–37)
Albumin: 3.5 g/dL (ref 3.5–5.2)
Bilirubin, Direct: 0.1 mg/dL (ref 0.0–0.3)
TOTAL PROTEIN: 6.6 g/dL (ref 6.0–8.3)
Total Bilirubin: 0.4 mg/dL (ref 0.2–1.2)

## 2014-10-17 LAB — BASIC METABOLIC PANEL
BUN: 30 mg/dL — AB (ref 6–23)
CO2: 25 mEq/L (ref 19–32)
Calcium: 8.9 mg/dL (ref 8.4–10.5)
Chloride: 106 mEq/L (ref 96–112)
Creatinine, Ser: 1.71 mg/dL — ABNORMAL HIGH (ref 0.40–1.20)
GFR: 29.35 mL/min — AB (ref >60.00)
Glucose, Bld: 124 mg/dL — ABNORMAL HIGH (ref 70–99)
Potassium: 4.2 mEq/L (ref 3.5–5.1)
SODIUM: 139 meq/L (ref 135–145)

## 2014-10-17 LAB — VITAMIN B12: VITAMIN B 12: 1128 pg/mL — AB (ref 211–911)

## 2014-10-17 LAB — TSH: TSH: 2.56 u[IU]/mL (ref 0.35–4.50)

## 2014-10-17 MED ORDER — SACCHAROMYCES BOULARDII 250 MG PO CAPS: 250.0000 mg | ORAL_CAPSULE | Freq: Two times a day (BID) | ORAL | Status: DC

## 2014-10-17 NOTE — Assessment & Plan Note (Signed)
On Vit B12 

## 2014-10-17 NOTE — Progress Notes (Signed)
Pre visit review using our clinic review tool, if applicable. No additional management support is needed unless otherwise documented below in the visit note. 

## 2014-10-17 NOTE — Assessment & Plan Note (Signed)
walker

## 2014-10-17 NOTE — Assessment & Plan Note (Signed)
Labs

## 2014-10-17 NOTE — Progress Notes (Signed)
   Subjective:    HPI  F/u generic Exforge is too big - hard to swallow -- using a brand Exforge F/u gout and anxiety F/u ankle swelling and itching - resolved F/u HTN, poor balance   Wt Readings from Last 3 Encounters:  10/17/14 115 lb (52.164 kg)  04/19/14 120 lb (54.432 kg)  10/10/13 123 lb (55.792 kg)   BP Readings from Last 3 Encounters:  10/17/14 118/78  04/19/14 110/82  10/10/13 140/88      Review of Systems  Constitutional: Negative for chills, activity change, appetite change, fatigue and unexpected weight change.  HENT: Negative for congestion, mouth sores and sinus pressure.   Eyes: Negative for visual disturbance.  Respiratory: Negative for cough and chest tightness.   Gastrointestinal: Negative for nausea and abdominal pain.  Genitourinary: Negative for frequency, difficulty urinating and vaginal pain.  Musculoskeletal: Negative for back pain, arthralgias and gait problem.  Skin: Negative for pallor and rash.  Neurological: Negative for dizziness, tremors, weakness, numbness and headaches.  Psychiatric/Behavioral: Negative for confusion and sleep disturbance.       Objective:   Physical Exam  Constitutional: She appears well-developed and well-nourished. No distress.  HENT:  Head: Normocephalic.  Right Ear: External ear normal.  Left Ear: External ear normal.  Nose: Nose normal.  Mouth/Throat: Oropharynx is clear and moist.  Eyes: Conjunctivae are normal. Pupils are equal, round, and reactive to light. Right eye exhibits no discharge. Left eye exhibits no discharge.  Neck: Normal range of motion. Neck supple. No JVD present. No tracheal deviation present. No thyromegaly present.  Cardiovascular: Normal rate, regular rhythm and normal heart sounds.   Pulmonary/Chest: No stridor. No respiratory distress. She has no wheezes.  Abdominal: Soft. Bowel sounds are normal. She exhibits no distension and no mass. There is no tenderness. There is no rebound and no  guarding.  Musculoskeletal: She exhibits no edema or tenderness.  Lymphadenopathy:    She has no cervical adenopathy.  Neurological: She displays normal reflexes. No cranial nerve deficit. She exhibits normal muscle tone. Coordination normal.  Skin: No rash noted. No erythema.  Psychiatric: She has a normal mood and affect. Her behavior is normal. Judgment and thought content normal.   Poor balance - better w/a walker    Lab Results  Component Value Date   WBC 6.2 10/10/2013   HGB 14.3 10/10/2013   HCT 43.5 10/10/2013   PLT 202.0 10/10/2013   GLUCOSE 116* 04/19/2014   CHOL 214* 10/10/2013   TRIG 246.0* 10/10/2013   HDL 46.10 10/10/2013   LDLCALC 119* 10/10/2013   ALT 11 10/10/2013   AST 22 10/10/2013   NA 141 04/19/2014   K 4.5 04/19/2014   CL 108 04/19/2014   CREATININE 1.7* 04/19/2014   BUN 25* 04/19/2014   CO2 26 04/19/2014   TSH 2.29 10/10/2013   HGBA1C 5.6 10/10/2013          Assessment & Plan:

## 2014-10-17 NOTE — Patient Instructions (Signed)
Florastor

## 2014-10-17 NOTE — Assessment & Plan Note (Signed)
Doing well Amlod-Olmesartan po

## 2015-04-15 ENCOUNTER — Ambulatory Visit: Payer: Medicare Other | Admitting: Internal Medicine

## 2015-04-23 ENCOUNTER — Encounter: Payer: Self-pay | Admitting: Internal Medicine

## 2015-04-23 ENCOUNTER — Ambulatory Visit (INDEPENDENT_AMBULATORY_CARE_PROVIDER_SITE_OTHER): Payer: Medicare HMO | Admitting: Internal Medicine

## 2015-04-23 ENCOUNTER — Other Ambulatory Visit (INDEPENDENT_AMBULATORY_CARE_PROVIDER_SITE_OTHER): Payer: Medicare HMO

## 2015-04-23 DIAGNOSIS — I129 Hypertensive chronic kidney disease with stage 1 through stage 4 chronic kidney disease, or unspecified chronic kidney disease: Secondary | ICD-10-CM

## 2015-04-23 DIAGNOSIS — E538 Deficiency of other specified B group vitamins: Secondary | ICD-10-CM

## 2015-04-23 DIAGNOSIS — H938X2 Other specified disorders of left ear: Secondary | ICD-10-CM

## 2015-04-23 DIAGNOSIS — K222 Esophageal obstruction: Secondary | ICD-10-CM | POA: Diagnosis not present

## 2015-04-23 DIAGNOSIS — N189 Chronic kidney disease, unspecified: Secondary | ICD-10-CM

## 2015-04-23 LAB — BASIC METABOLIC PANEL
BUN: 33 mg/dL — ABNORMAL HIGH (ref 6–23)
CALCIUM: 9 mg/dL (ref 8.4–10.5)
CO2: 26 meq/L (ref 19–32)
CREATININE: 1.83 mg/dL — AB (ref 0.40–1.20)
Chloride: 107 mEq/L (ref 96–112)
GFR: 27.11 mL/min — AB (ref >60.00)
Glucose, Bld: 99 mg/dL (ref 70–99)
Potassium: 4.2 mEq/L (ref 3.5–5.1)
Sodium: 141 mEq/L (ref 135–145)

## 2015-04-23 MED ORDER — AMLODIPINE-OLMESARTAN 5-40 MG PO TABS: 1.0000 | ORAL_TABLET | Freq: Every day | ORAL | Status: DC

## 2015-04-23 NOTE — Assessment & Plan Note (Signed)
On Omeprazole po qd

## 2015-04-23 NOTE — Assessment & Plan Note (Signed)
On B12 

## 2015-04-23 NOTE — Progress Notes (Signed)
   Subjective:  Patient ID: Mckenzie Young, female    DOB: 12-Mar-1916  Age: 81 y.o. MRN: [id number]  CC: No chief complaint on file.   HPI Mckenzie Young presents for HTN and GERD f/u C/o L ear lesion x months, worse lately  Outpatient Prescriptions Prior to Visit  Medication Sig Dispense Refill  . brimonidine (ALPHAGAN) 0.2 % ophthalmic solution Place into both eyes.    . Cholecalciferol 1000 UNITS tablet Take 1,000 Units by mouth daily.      . Cyanocobalamin (VITAMIN B-12) 500 MCG SUBL 1 tab SL qd 100 tablet 3  . dorzolamide-timolol (COSOPT) 22.3-6.8 MG/ML ophthalmic solution Place into both eyes.    Marland Kitchen. omeprazole (PRILOSEC) 20 MG capsule Take 20 mg by mouth daily.      . prednisoLONE acetate (PRED FORTE) 1 % ophthalmic suspension Place into both eyes.    Marland Kitchen. saccharomyces boulardii (FLORASTOR) 250 MG capsule Take 1 capsule (250 mg total) by mouth 2 (two) times daily. 60 capsule 2  . amLODipine-olmesartan (AZOR) 5-40 MG per tablet Take 1 tablet by mouth daily. 90 tablet 3   No facility-administered medications prior to visit.    ROS Review of Systems  Objective:  There were no vitals taken for this visit.  BP Readings from Last 3 Encounters:  10/17/14 118/78  04/19/14 110/82  10/10/13 140/88    Wt Readings from Last 3 Encounters:  10/17/14 115 lb (52.164 kg)  04/19/14 120 lb (54.432 kg)  10/10/13 123 lb (55.792 kg)    Physical Exam L ear with 1 cm nodule Lab Results  Component Value Date   WBC 6.2 10/10/2013   HGB 14.3 10/10/2013   HCT 43.5 10/10/2013   PLT 202.0 10/10/2013   GLUCOSE 99 04/23/2015   CHOL 214* 10/10/2013   TRIG 246.0* 10/10/2013   HDL 46.10 10/10/2013   LDLCALC 119* 10/10/2013   ALT 8 10/17/2014   AST 16 10/17/2014   NA 141 04/23/2015   K 4.2 04/23/2015   CL 107 04/23/2015   CREATININE 1.83* 04/23/2015   BUN 33* 04/23/2015   CO2 26 04/23/2015   TSH 2.56 10/17/2014   HGBA1C 5.6 10/10/2013    Dg Ankle Complete Left  09/04/2010   *RADIOLOGY REPORT* Clinical Data: Fall 1 week ago.  Pain with swelling. LEFT ANKLE COMPLETE - 3+ VIEW Comparison: None. Findings: Fracture of the distal left fibula / lateral malleolus. Adjacent soft tissue swelling. Plantar spur. IMPRESSION: Fracture of the distal left fibula / lateral malleolus. Original Report Authenticated By: Fuller CanadaSTEVEN R. OLSON, M.D.   Assessment & Plan:   Diagnoses and all orders for this visit:  Mass of left ear -     Ambulatory referral to ENT  Hypertensive renal failure -     Basic metabolic panel; Future  B12 deficiency  ESOPHAGEAL STRICTURE, LAST DILITATION 05/31/00  Other orders -     amLODipine-olmesartan (AZOR) 5-40 MG tablet; Take 1 tablet by mouth daily.   I have changed Mckenzie Young's amLODipine-olmesartan. I am also having her maintain her omeprazole, Cholecalciferol, Vitamin B-12, brimonidine, dorzolamide-timolol, prednisoLONE acetate, and saccharomyces boulardii.  Meds ordered this encounter  Medications  . amLODipine-olmesartan (AZOR) 5-40 MG tablet    Sig: Take 1 tablet by mouth daily.    Dispense:  90 tablet    Refill:  3     Follow-up: Return in about 6 months (around 10/21/2015) for a follow-up visit.  Sonda PrimesAlex Loria Lacina, MD

## 2015-04-23 NOTE — Assessment & Plan Note (Signed)
11/16 L ear with 1 cm nodule - r/o cancer ENT ref Dr Ezzard StandingNewman

## 2015-04-23 NOTE — Assessment & Plan Note (Signed)
Azor [employer]

## 2015-04-23 NOTE — Progress Notes (Signed)
Pre visit review using our clinic review tool, if applicable. No additional management support is needed unless otherwise documented below in the visit note. 

## 2015-05-16 ENCOUNTER — Other Ambulatory Visit: Payer: Self-pay | Admitting: Otolaryngology

## 2015-06-27 ENCOUNTER — Telehealth: Payer: Self-pay | Admitting: Internal Medicine

## 2015-06-27 NOTE — Telephone Encounter (Signed)
pts daughter called stating pt has a cough with no other symptoms. She's wondering if there is something that can be called in or will she need to come in for an OV

## 2015-06-27 NOTE — Telephone Encounter (Signed)
Use Delsym OTC, Ricola cough drops OV if not better

## 2015-06-28 NOTE — Telephone Encounter (Signed)
Notified pt daughter with md response.../lmb 

## 2015-07-02 ENCOUNTER — Telehealth: Payer: Self-pay | Admitting: Internal Medicine

## 2015-07-02 NOTE — Telephone Encounter (Signed)
Pt daughter called in and said that pt has a cough.Mckenzie Young doesn't have any openings.  i offered to get her in 1st thing in the morning.  She doesn't wanna see anyone but plot and refused to come in and just wanted to talk with nurse

## 2015-07-03 NOTE — Telephone Encounter (Signed)
Called pt gave md response. Made appt for thurs am @ 9:15...Raechel Chute

## 2015-07-03 NOTE — Telephone Encounter (Signed)
Work in on [place] if needed. Use Delsym, Ricola prn Thx

## 2015-07-04 ENCOUNTER — Ambulatory Visit (INDEPENDENT_AMBULATORY_CARE_PROVIDER_SITE_OTHER): Payer: PPO | Admitting: Internal Medicine

## 2015-07-04 ENCOUNTER — Ambulatory Visit (INDEPENDENT_AMBULATORY_CARE_PROVIDER_SITE_OTHER)
Admission: RE | Admit: 2015-07-04 | Discharge: 2015-07-04 | Disposition: A | Discharge status: Home / Self Care | Payer: PPO | Source: Ambulatory Visit | Attending: Internal Medicine | Admitting: Internal Medicine

## 2015-07-04 ENCOUNTER — Encounter: Payer: Self-pay | Admitting: Internal Medicine

## 2015-07-04 VITALS — BP 128/70 | HR 84 | Temp 97.5°F | Wt 116.0 lb

## 2015-07-04 DIAGNOSIS — J45909 Unspecified asthma, uncomplicated: Secondary | ICD-10-CM | POA: Insufficient documentation

## 2015-07-04 DIAGNOSIS — J4521 Mild intermittent asthma with (acute) exacerbation: Secondary | ICD-10-CM

## 2015-07-04 DIAGNOSIS — I129 Hypertensive chronic kidney disease with stage 1 through stage 4 chronic kidney disease, or unspecified chronic kidney disease: Secondary | ICD-10-CM

## 2015-07-04 DIAGNOSIS — R05 Cough: Secondary | ICD-10-CM

## 2015-07-04 DIAGNOSIS — R059 Cough, unspecified: Secondary | ICD-10-CM

## 2015-07-04 DIAGNOSIS — N189 Chronic kidney disease, unspecified: Secondary | ICD-10-CM

## 2015-07-04 MED ORDER — FLUTICASONE FUROATE-VILANTEROL 100-25 MCG/INH IN AEPB: 1.0000 | INHALATION_SPRAY | Freq: Every day | RESPIRATORY_TRACT | Status: AC

## 2015-07-04 MED ORDER — AMLODIPINE-OLMESARTAN 5-40 MG PO TABS: 1.0000 | ORAL_TABLET | Freq: Every day | ORAL | Status: DC

## 2015-07-04 MED ORDER — PROMETHAZINE-CODEINE 6.25-10 MG/5ML PO SYRP: 5.0000 mL | ORAL_SOLUTION | ORAL | Status: DC | PRN

## 2015-07-04 MED ORDER — AZITHROMYCIN 250 MG PO TABS
ORAL_TABLET | ORAL | Status: DC
Start: 2015-07-04 — End: 2015-10-15

## 2015-07-04 NOTE — Progress Notes (Signed)
Pre visit review using our clinic review tool, if applicable. No additional management support is needed unless otherwise documented below in the visit note. 

## 2015-07-04 NOTE — Assessment & Plan Note (Signed)
  Amlod-Olmesartan po

## 2015-07-04 NOTE — Progress Notes (Signed)
Subjective:  Patient ID: Mckenzie Young, female    DOB: [birth year]  Age: 82 y.o. MRN: [id number]  CC: Cough   HPI [place] presents for a dry cough x 1 week day and night 10 years ago - inhaler helped  Outpatient Prescriptions Prior to Visit  Medication Sig Dispense Refill  . amLODipine-olmesartan (AZOR) 5-40 MG tablet Take 1 tablet by mouth daily. 90 tablet 3  . brimonidine (ALPHAGAN) 0.2 % ophthalmic solution Place into both eyes.    . Cholecalciferol 1000 UNITS tablet Take 1,000 Units by mouth daily.      . Cyanocobalamin (VITAMIN B-12) 500 MCG SUBL 1 tab SL qd 100 tablet 3  . dorzolamide-timolol (COSOPT) 22.3-6.8 MG/ML ophthalmic solution Place into both eyes.    Marland Kitchen omeprazole (PRILOSEC) 20 MG capsule Take 20 mg by mouth daily.      . prednisoLONE acetate (PRED FORTE) 1 % ophthalmic suspension Place into both eyes.    Marland Kitchen saccharomyces boulardii (FLORASTOR) 250 MG capsule Take 1 capsule (250 mg total) by mouth 2 (two) times daily. 60 capsule 2   No facility-administered medications prior to visit.    ROS Review of Systems  Constitutional: Negative for fever and fatigue.  Respiratory: Positive for cough. Negative for shortness of breath and wheezing.     Objective:  BP 128/70 mmHg  Pulse 84  Temp(Src) 97.5 F (36.4 C) (Oral)  Wt 116 lb (52.617 kg)  SpO2 93%  BP Readings from Last 3 Encounters:  07/04/15 128/70  10/17/14 118/78  04/19/14 110/82    Wt Readings from Last 3 Encounters:  07/04/15 116 lb (52.617 kg)  10/17/14 115 lb (52.164 kg)  04/19/14 120 lb (54.432 kg)    Physical Exam  Constitutional: She appears well-developed. No distress.  HENT:  Head: Normocephalic.  Right Ear: External ear normal.  Left Ear: External ear normal.  Nose: Nose normal.  Mouth/Throat: Oropharynx is clear and moist.  Eyes: Conjunctivae are normal. Pupils are equal, round, and reactive to light. Right eye exhibits no discharge. Left eye exhibits no discharge.  Neck:  Normal range of motion. Neck supple. No JVD present. No tracheal deviation present. No thyromegaly present.  Cardiovascular: Normal rate, regular rhythm and normal heart sounds.   Pulmonary/Chest: No stridor. No respiratory distress. She has no wheezes.  Abdominal: Soft. Bowel sounds are normal. She exhibits no distension and no mass. There is no tenderness. There is no rebound and no guarding.  Musculoskeletal: She exhibits no edema or tenderness.  Lymphadenopathy:    She has no cervical adenopathy.  Neurological: She displays normal reflexes. No cranial nerve deficit. She exhibits normal muscle tone. Coordination normal.  Skin: No rash noted. No erythema.  Psychiatric: She has a normal mood and affect. Her behavior is normal. Judgment and thought content normal.  walker Mild B ronchi   I personally provided Breo inhaler use teaching. After the teaching patient was able to demonstrate it's use effectively. All questions were answered   Lab Results  Component Value Date   WBC 6.2 10/10/2013   HGB 14.3 10/10/2013   HCT 43.5 10/10/2013   PLT 202.0 10/10/2013   GLUCOSE 99 04/23/2015   CHOL 214* 10/10/2013   TRIG 246.0* 10/10/2013   HDL 46.10 10/10/2013   LDLCALC 119* 10/10/2013   ALT 8 10/17/2014   AST 16 10/17/2014   NA 141 04/23/2015   K 4.2 04/23/2015   CL 107 04/23/2015   CREATININE 1.83* 04/23/2015   BUN 33* 04/23/2015   CO2  26 04/23/2015   TSH 2.56 10/17/2014   HGBA1C 5.6 10/10/2013    Dg Ankle Complete Left  09/04/2010  *RADIOLOGY REPORT* Clinical Data: Fall 1 week ago.  Pain with swelling. LEFT ANKLE COMPLETE - 3+ VIEW Comparison: None. Findings: Fracture of the distal left fibula / lateral malleolus. Adjacent soft tissue swelling. Plantar spur. IMPRESSION: Fracture of the distal left fibula / lateral malleolus. Original Report Authenticated By: Fuller Canada, M.D.   Assessment & Plan:   There are no diagnoses linked to this encounter. I am having Ms. Ladona Ridgel  maintain her omeprazole, Cholecalciferol, Vitamin B-12, brimonidine, dorzolamide-timolol, prednisoLONE acetate, saccharomyces boulardii, and amLODipine-olmesartan.  No orders of the defined types were placed in this encounter.     Follow-up: No Follow-up on file.  Sonda Primes, MD

## 2015-07-05 ENCOUNTER — Telehealth: Payer: Self-pay | Admitting: Internal Medicine

## 2015-07-05 NOTE — Telephone Encounter (Signed)
Requesting call back with x ray result.

## 2015-07-08 NOTE — Telephone Encounter (Signed)
Pt's daughter informed of below.   See below from xray results:  Dear Mrs Cilia  Your chest x ray shows a possible pneumonia in the right lung. Please, start the antibiotic. Feel better!  Sincerely,  Sonda Primes, MD

## 2015-08-29 ENCOUNTER — Encounter (INDEPENDENT_AMBULATORY_CARE_PROVIDER_SITE_OTHER): Payer: Self-pay

## 2015-10-09 DIAGNOSIS — H401133 Primary open-angle glaucoma, bilateral, severe stage: Secondary | ICD-10-CM | POA: Diagnosis not present

## 2015-10-09 DIAGNOSIS — H1851 Endothelial corneal dystrophy: Secondary | ICD-10-CM | POA: Diagnosis not present

## 2015-10-09 DIAGNOSIS — Z947 Corneal transplant status: Secondary | ICD-10-CM | POA: Diagnosis not present

## 2015-10-15 ENCOUNTER — Ambulatory Visit (INDEPENDENT_AMBULATORY_CARE_PROVIDER_SITE_OTHER): Payer: PPO | Admitting: Internal Medicine

## 2015-10-15 ENCOUNTER — Other Ambulatory Visit (INDEPENDENT_AMBULATORY_CARE_PROVIDER_SITE_OTHER): Payer: PPO

## 2015-10-15 ENCOUNTER — Encounter: Payer: Self-pay | Admitting: Internal Medicine

## 2015-10-15 VITALS — BP 130/88 | HR 81 | Wt 112.0 lb

## 2015-10-15 DIAGNOSIS — E538 Deficiency of other specified B group vitamins: Secondary | ICD-10-CM

## 2015-10-15 DIAGNOSIS — I10 Essential (primary) hypertension: Secondary | ICD-10-CM

## 2015-10-15 LAB — BASIC METABOLIC PANEL
BUN: 24 mg/dL — AB (ref 6–23)
CALCIUM: 9 mg/dL (ref 8.4–10.5)
CO2: 26 meq/L (ref 19–32)
CREATININE: 1.63 mg/dL — AB (ref 0.40–1.20)
Chloride: 107 mEq/L (ref 96–112)
GFR: 30.95 mL/min — ABNORMAL LOW (ref >60.00)
GLUCOSE: 89 mg/dL (ref 70–99)
Potassium: 4.3 mEq/L (ref 3.5–5.1)
Sodium: 140 mEq/L (ref 135–145)

## 2015-10-15 LAB — VITAMIN B12: Vitamin B-12: 1360 pg/mL — ABNORMAL HIGH (ref 211–911)

## 2015-10-15 LAB — HEPATIC FUNCTION PANEL
ALBUMIN: 3.8 g/dL (ref 3.5–5.2)
ALT: 9 U/L (ref 0–35)
AST: 17 U/L (ref 0–37)
Alkaline Phosphatase: 61 U/L (ref 39–117)
Bilirubin, Direct: 0 mg/dL (ref 0.0–0.3)
TOTAL PROTEIN: 6.5 g/dL (ref 6.0–8.3)
Total Bilirubin: 0.4 mg/dL (ref 0.2–1.2)

## 2015-10-15 MED ORDER — AZOR 5-40 MG PO TABS: 1.0000 | ORAL_TABLET | Freq: Every day | ORAL | Status: DC

## 2015-10-15 NOTE — Assessment & Plan Note (Signed)
Vit B12 Labs

## 2015-10-15 NOTE — Progress Notes (Signed)
Subjective:  Patient ID: Mckenzie Young, female    DOB: [birth year]  Age: 82 y.o. MRN: [id number]  CC: No chief complaint on file.   HPI [place] presents for HTN, OA, B12 def f/u  Outpatient Prescriptions Prior to Visit  Medication Sig Dispense Refill  . brimonidine (ALPHAGAN) 0.2 % ophthalmic solution Place into both eyes.    . Cholecalciferol 1000 UNITS tablet Take 1,000 Units by mouth daily.      . Cyanocobalamin (VITAMIN B-12) 500 MCG SUBL 1 tab SL qd 100 tablet 3  . dorzolamide-timolol (COSOPT) 22.3-6.8 MG/ML ophthalmic solution Place into both eyes.    . Fluticasone Furoate-Vilanterol (BREO ELLIPTA) 100-25 MCG/INH AEPB Inhale 1 puff into the lungs daily. 1 each 5  . omeprazole (PRILOSEC) 20 MG capsule Take 20 mg by mouth daily.      . prednisoLONE acetate (PRED FORTE) 1 % ophthalmic suspension Place into both eyes.    . promethazine-codeine (PHENERGAN WITH CODEINE) 6.25-10 MG/5ML syrup Take 5 mLs by mouth every 4 (four) hours as needed. 300 mL 0  . saccharomyces boulardii (FLORASTOR) 250 MG capsule Take 1 capsule (250 mg total) by mouth 2 (two) times daily. 60 capsule 2  . amLODipine-olmesartan (AZOR) 5-40 MG tablet Take 1 tablet by mouth daily. 90 tablet 3  . azithromycin (ZITHROMAX Z-PAK) 250 MG tablet As directed 6 each 0   No facility-administered medications prior to visit.    ROS Review of Systems  Constitutional: Negative for chills, activity change, appetite change, fatigue and unexpected weight change.  HENT: Negative for congestion, mouth sores and sinus pressure.   Eyes: Negative for visual disturbance.  Respiratory: Negative for cough and chest tightness.   Gastrointestinal: Negative for nausea and abdominal pain.  Genitourinary: Negative for frequency, difficulty urinating and vaginal pain.  Musculoskeletal: Positive for back pain and gait problem.  Skin: Negative for pallor and rash.  Neurological: Negative for dizziness, tremors, weakness, numbness  and headaches.  Psychiatric/Behavioral: Negative for suicidal ideas, confusion, sleep disturbance and decreased concentration. The patient is nervous/anxious.     Objective:  BP 130/88 mmHg  Pulse 81  Wt 112 lb (50.803 kg)  SpO2 95%  BP Readings from Last 3 Encounters:  10/15/15 130/88  07/04/15 128/70  10/17/14 118/78    Wt Readings from Last 3 Encounters:  10/15/15 112 lb (50.803 kg)  07/04/15 116 lb (52.617 kg)  10/17/14 115 lb (52.164 kg)    Physical Exam  Constitutional: She appears well-developed. No distress.  HENT:  Head: Normocephalic.  Right Ear: External ear normal.  Left Ear: External ear normal.  Nose: Nose normal.  Mouth/Throat: Oropharynx is clear and moist.  Eyes: Conjunctivae are normal. Pupils are equal, round, and reactive to light. Right eye exhibits no discharge. Left eye exhibits no discharge.  Neck: Normal range of motion. Neck supple. No JVD present. No tracheal deviation present. No thyromegaly present.  Cardiovascular: Normal rate, regular rhythm and normal heart sounds.   Pulmonary/Chest: No stridor. No respiratory distress. She has no wheezes.  Abdominal: Soft. Bowel sounds are normal. She exhibits no distension and no mass. There is no tenderness. There is no rebound and no guarding.  Musculoskeletal: She exhibits tenderness. She exhibits no edema.  Lymphadenopathy:    She has no cervical adenopathy.  Neurological: She displays normal reflexes. No cranial nerve deficit. She exhibits normal muscle tone. Coordination normal.  Skin: No rash noted. No erythema.  Psychiatric: She has a normal mood and affect. Her behavior is normal. Judgment  and thought content normal.  Walker  Lab Results  Component Value Date   WBC 6.2 10/10/2013   HGB 14.3 10/10/2013   HCT 43.5 10/10/2013   PLT 202.0 10/10/2013   GLUCOSE 99 04/23/2015   CHOL 214* 10/10/2013   TRIG 246.0* 10/10/2013   HDL 46.10 10/10/2013   LDLCALC 119* 10/10/2013   ALT 8 10/17/2014    AST 16 10/17/2014   NA 141 04/23/2015   K 4.2 04/23/2015   CL 107 04/23/2015   CREATININE 1.83* 04/23/2015   BUN 33* 04/23/2015   CO2 26 04/23/2015   TSH 2.56 10/17/2014   HGBA1C 5.6 10/10/2013    Dg Chest 2 View  07/04/2015  CLINICAL DATA:  Dry cough.  Hypertension. EXAM: CHEST  2 VIEW COMPARISON:  No prior available. FINDINGS: Mediastinum and hilar structures are normal . Borderline cardiomegaly. Mild infiltrate right lung base cannot be excluded. No pleural effusion or pneumothorax. Small sliding hiatal hernia cannot be excluded. No acute bony abnormality. Surgical clips right upper quadrant. IMPRESSION: 1.  Mild infiltrate right lung base cannot be excluded. 2.  Borderline cardiomegaly.  No CHF. 3.  Probable small sliding hiatal hernia. Electronically Signed   By: Maisie Fushomas  Register   On: 07/04/2015 10:00    Assessment & Plan:   There are no diagnoses linked to this encounter. I have discontinued Ms. Knoble's azithromycin. I have also changed her amLODipine-olmesartan to AZOR. Additionally, I am having her maintain her omeprazole, Cholecalciferol, Vitamin B-12, brimonidine, dorzolamide-timolol, prednisoLONE acetate, saccharomyces boulardii, fluticasone furoate-vilanterol, and promethazine-codeine.  Meds ordered this encounter  Medications  . AZOR 5-40 MG tablet    Sig: Take 1 tablet by mouth daily.    Dispense:  90 tablet    Refill:  3    Brand name please     Follow-up: No Follow-up on file.  Sonda PrimesAlex Mariafernanda Hendricksen, MD

## 2015-10-15 NOTE — Progress Notes (Signed)
Pre visit review using our clinic review tool, if applicable. No additional management support is needed unless otherwise documented below in the visit note. 

## 2016-04-15 DIAGNOSIS — H1851 Endothelial corneal dystrophy: Secondary | ICD-10-CM | POA: Diagnosis not present

## 2016-04-15 DIAGNOSIS — Z947 Corneal transplant status: Secondary | ICD-10-CM | POA: Diagnosis not present

## 2016-04-15 DIAGNOSIS — H401133 Primary open-angle glaucoma, bilateral, severe stage: Secondary | ICD-10-CM | POA: Diagnosis not present

## 2016-04-16 ENCOUNTER — Ambulatory Visit (INDEPENDENT_AMBULATORY_CARE_PROVIDER_SITE_OTHER): Payer: PPO | Admitting: Internal Medicine

## 2016-04-16 ENCOUNTER — Encounter: Payer: Self-pay | Admitting: Internal Medicine

## 2016-04-16 DIAGNOSIS — E538 Deficiency of other specified B group vitamins: Secondary | ICD-10-CM

## 2016-04-16 DIAGNOSIS — I129 Hypertensive chronic kidney disease with stage 1 through stage 4 chronic kidney disease, or unspecified chronic kidney disease: Secondary | ICD-10-CM | POA: Diagnosis not present

## 2016-04-16 MED ORDER — VITAMIN B-12 500 MCG SL SUBL: SUBLINGUAL_TABLET | SUBLINGUAL | 3 refills | Status: AC

## 2016-04-16 NOTE — Progress Notes (Signed)
Pre visit review using our clinic review tool, if applicable. No additional management support is needed unless otherwise documented below in the visit note. 

## 2016-04-16 NOTE — Assessment & Plan Note (Signed)
Amlodipine-Losartan Labs

## 2016-04-16 NOTE — Progress Notes (Signed)
Subjective:  Patient ID: Mckenzie HuskyVirginia Young, female    DOB: 1915/10/25  Age: 82 y.o. MRN: [id number]  CC: No chief complaint on file.   HPI Mckenzie Young presents for HTN, CRI, gout f/u  Outpatient Medications Prior to Visit  Medication Sig Dispense Refill  . AZOR 5-40 MG tablet Take 1 tablet by mouth daily. 90 tablet 3  . brimonidine (ALPHAGAN) 0.2 % ophthalmic solution Place into both eyes.    . Cholecalciferol 1000 UNITS tablet Take 1,000 Units by mouth daily.      . Cyanocobalamin (VITAMIN B-12) 500 MCG SUBL 1 tab SL qd 100 tablet 3  . dorzolamide-timolol (COSOPT) 22.3-6.8 MG/ML ophthalmic solution Place into both eyes.    . Fluticasone Furoate-Vilanterol (BREO ELLIPTA) 100-25 MCG/INH AEPB Inhale 1 puff into the lungs daily. 1 each 5  . omeprazole (PRILOSEC) 20 MG capsule Take 20 mg by mouth daily.      . prednisoLONE acetate (PRED FORTE) 1 % ophthalmic suspension Place into both eyes.    . promethazine-codeine (PHENERGAN WITH CODEINE) 6.25-10 MG/5ML syrup Take 5 mLs by mouth every 4 (four) hours as needed. 300 mL 0  . saccharomyces boulardii (FLORASTOR) 250 MG capsule Take 1 capsule (250 mg total) by mouth 2 (two) times daily. 60 capsule 2   No facility-administered medications prior to visit.     ROS Review of Systems  Constitutional: Negative for activity change, appetite change, chills, fatigue and unexpected weight change.  HENT: Negative for congestion, mouth sores and sinus pressure.   Eyes: Negative for visual disturbance.  Respiratory: Negative for cough and chest tightness.   Gastrointestinal: Negative for abdominal pain and nausea.  Genitourinary: Negative for difficulty urinating, frequency and vaginal pain.  Musculoskeletal: Positive for gait problem. Negative for back pain.  Skin: Negative for pallor and rash.  Neurological: Negative for dizziness, tremors, weakness, numbness and headaches.  Psychiatric/Behavioral: Negative for confusion and sleep disturbance.      Objective:  BP 120/70   Pulse 75   Wt 114 lb (51.7 kg)   SpO2 97%   BMI 23.03 kg/m   BP Readings from Last 3 Encounters:  04/16/16 120/70  10/15/15 130/88  07/04/15 128/70    Wt Readings from Last 3 Encounters:  04/16/16 114 lb (51.7 kg)  10/15/15 112 lb (50.8 kg)  07/04/15 116 lb (52.6 kg)    Physical Exam  Constitutional: She appears well-developed. No distress.  HENT:  Head: Normocephalic.  Right Ear: External ear normal.  Left Ear: External ear normal.  Nose: Nose normal.  Mouth/Throat: Oropharynx is clear and moist.  Eyes: Conjunctivae are normal. Pupils are equal, round, and reactive to light. Right eye exhibits no discharge. Left eye exhibits no discharge.  Neck: Normal range of motion. Neck supple. No JVD present. No tracheal deviation present. No thyromegaly present.  Cardiovascular: Normal rate, regular rhythm and normal heart sounds.   Pulmonary/Chest: No stridor. No respiratory distress. She has no wheezes.  Abdominal: Soft. Bowel sounds are normal. She exhibits no distension and no mass. There is no tenderness. There is no rebound and no guarding.  Musculoskeletal: She exhibits no edema or tenderness.  Lymphadenopathy:    She has no cervical adenopathy.  Neurological: She displays normal reflexes. No cranial nerve deficit. She exhibits normal muscle tone. Coordination abnormal.  Skin: No rash noted. No erythema.  Psychiatric: She has a normal mood and affect. Her behavior is normal. Judgment and thought content normal.    Lab Results  Component Value Date  WBC 6.2 10/10/2013   HGB 14.3 10/10/2013   HCT 43.5 10/10/2013   PLT 202.0 10/10/2013   GLUCOSE 89 10/15/2015   CHOL 214 (H) 10/10/2013   TRIG 246.0 (H) 10/10/2013   HDL 46.10 10/10/2013   LDLCALC 119 (H) 10/10/2013   ALT 9 10/15/2015   AST 17 10/15/2015   NA 140 10/15/2015   K 4.3 10/15/2015   CL 107 10/15/2015   CREATININE 1.63 (H) 10/15/2015   BUN 24 (H) 10/15/2015   CO2 26  10/15/2015   TSH 2.56 10/17/2014   HGBA1C 5.6 10/10/2013    Dg Chest 2 View  Result Date: 07/04/2015 CLINICAL DATA:  Dry cough.  Hypertension. EXAM: CHEST  2 VIEW COMPARISON:  No prior available. FINDINGS: Mediastinum and hilar structures are normal . Borderline cardiomegaly. Mild infiltrate right lung base cannot be excluded. No pleural effusion or pneumothorax. Small sliding hiatal hernia cannot be excluded. No acute bony abnormality. Surgical clips right upper quadrant. IMPRESSION: 1.  Mild infiltrate right lung base cannot be excluded. 2.  Borderline cardiomegaly.  No CHF. 3.  Probable small sliding hiatal hernia. Electronically Signed   By: Maisie Fushomas  Register   On: 07/04/2015 10:00    Assessment & Plan:   There are no diagnoses linked to this encounter. I am having Mckenzie Young maintain her omeprazole, Cholecalciferol, Vitamin B-12, brimonidine, dorzolamide-timolol, prednisoLONE acetate, saccharomyces boulardii, fluticasone furoate-vilanterol, promethazine-codeine, and AZOR.  No orders of the defined types were placed in this encounter.    Follow-up: No Follow-up on file.  Sonda PrimesAlex Marquan Vokes, MD

## 2016-04-16 NOTE — Assessment & Plan Note (Signed)
On B12 

## 2016-06-19 ENCOUNTER — Telehealth: Payer: Self-pay | Admitting: Internal Medicine

## 2016-06-19 MED ORDER — MELOXICAM 7.5 MG PO TABS: 7.5000 mg | ORAL_TABLET | Freq: Every day | ORAL | 0 refills | Status: DC

## 2016-06-19 NOTE — Telephone Encounter (Signed)
Rec'd call daughter states mom has gout in her (R) foot/ big toe. Would like MD to send prescription to [facility] mom states its very painful...Raechel Chute/lmb

## 2016-06-19 NOTE — Telephone Encounter (Signed)
Meloxicam Rx emailed Thx

## 2016-06-19 NOTE — Telephone Encounter (Signed)
Dtr Adele called (VM) re: a toe pain - I could not understand the recording. Pls call her Thx

## 2016-06-19 NOTE — Telephone Encounter (Signed)
Notified pt daughter MD sent rx to [facility].../lmb

## 2016-07-16 ENCOUNTER — Other Ambulatory Visit: Payer: Self-pay | Admitting: Internal Medicine

## 2016-08-26 ENCOUNTER — Other Ambulatory Visit: Payer: Self-pay | Admitting: Internal Medicine

## 2016-09-11 DIAGNOSIS — Z7982 Long term (current) use of aspirin: Secondary | ICD-10-CM | POA: Diagnosis not present

## 2016-09-11 DIAGNOSIS — H409 Unspecified glaucoma: Secondary | ICD-10-CM | POA: Diagnosis not present

## 2016-09-11 DIAGNOSIS — Z66 Do not resuscitate: Secondary | ICD-10-CM | POA: Diagnosis not present

## 2016-09-11 DIAGNOSIS — D500 Iron deficiency anemia secondary to blood loss (chronic): Secondary | ICD-10-CM | POA: Diagnosis not present

## 2016-09-11 DIAGNOSIS — I129 Hypertensive chronic kidney disease with stage 1 through stage 4 chronic kidney disease, or unspecified chronic kidney disease: Secondary | ICD-10-CM | POA: Diagnosis not present

## 2016-09-11 DIAGNOSIS — R8271 Bacteriuria: Secondary | ICD-10-CM | POA: Diagnosis not present

## 2016-09-11 DIAGNOSIS — N179 Acute kidney failure, unspecified: Secondary | ICD-10-CM | POA: Diagnosis not present

## 2016-09-11 DIAGNOSIS — B951 Streptococcus, group B, as the cause of diseases classified elsewhere: Secondary | ICD-10-CM | POA: Diagnosis not present

## 2016-09-11 DIAGNOSIS — W19XXXA Unspecified fall, initial encounter: Secondary | ICD-10-CM | POA: Diagnosis not present

## 2016-09-11 DIAGNOSIS — N189 Chronic kidney disease, unspecified: Secondary | ICD-10-CM | POA: Diagnosis not present

## 2016-09-11 DIAGNOSIS — S72142A Displaced intertrochanteric fracture of left femur, initial encounter for closed fracture: Secondary | ICD-10-CM | POA: Diagnosis not present

## 2016-09-11 DIAGNOSIS — W1830XA Fall on same level, unspecified, initial encounter: Secondary | ICD-10-CM | POA: Diagnosis not present

## 2016-09-12 DIAGNOSIS — S72142A Displaced intertrochanteric fracture of left femur, initial encounter for closed fracture: Secondary | ICD-10-CM | POA: Diagnosis not present

## 2016-09-28 DIAGNOSIS — Z5181 Encounter for therapeutic drug level monitoring: Secondary | ICD-10-CM | POA: Diagnosis not present

## 2016-09-28 DIAGNOSIS — Z79899 Other long term (current) drug therapy: Secondary | ICD-10-CM | POA: Diagnosis not present

## 2016-09-28 DIAGNOSIS — S72142D Displaced intertrochanteric fracture of left femur, subsequent encounter for closed fracture with routine healing: Secondary | ICD-10-CM | POA: Diagnosis not present

## 2016-10-13 ENCOUNTER — Ambulatory Visit: Payer: Medicare Other | Admitting: Internal Medicine

## 2016-10-13 DIAGNOSIS — I10 Essential (primary) hypertension: Secondary | ICD-10-CM | POA: Diagnosis not present

## 2016-10-13 DIAGNOSIS — Z4789 Encounter for other orthopedic aftercare: Secondary | ICD-10-CM | POA: Diagnosis not present

## 2016-10-13 DIAGNOSIS — S42145D Nondisplaced fracture of glenoid cavity of scapula, left shoulder, subsequent encounter for fracture with routine healing: Secondary | ICD-10-CM | POA: Diagnosis not present

## 2016-10-13 DIAGNOSIS — Z9181 History of falling: Secondary | ICD-10-CM | POA: Diagnosis not present

## 2016-10-13 DIAGNOSIS — M25552 Pain in left hip: Secondary | ICD-10-CM | POA: Diagnosis not present

## 2016-10-13 DIAGNOSIS — R2681 Unsteadiness on feet: Secondary | ICD-10-CM | POA: Diagnosis not present

## 2016-10-13 DIAGNOSIS — M6281 Muscle weakness (generalized): Secondary | ICD-10-CM | POA: Diagnosis not present

## 2016-10-13 DIAGNOSIS — M25652 Stiffness of left hip, not elsewhere classified: Secondary | ICD-10-CM | POA: Diagnosis not present

## 2016-10-13 DIAGNOSIS — W07XXXD Fall from chair, subsequent encounter: Secondary | ICD-10-CM | POA: Diagnosis not present

## 2016-10-13 DIAGNOSIS — M7989 Other specified soft tissue disorders: Secondary | ICD-10-CM | POA: Diagnosis not present

## 2016-10-14 DIAGNOSIS — H1851 Endothelial corneal dystrophy: Secondary | ICD-10-CM | POA: Diagnosis not present

## 2016-10-14 DIAGNOSIS — Z947 Corneal transplant status: Secondary | ICD-10-CM | POA: Diagnosis not present

## 2016-10-14 DIAGNOSIS — H401133 Primary open-angle glaucoma, bilateral, severe stage: Secondary | ICD-10-CM | POA: Diagnosis not present

## 2016-10-29 DIAGNOSIS — S72142D Displaced intertrochanteric fracture of left femur, subsequent encounter for closed fracture with routine healing: Secondary | ICD-10-CM | POA: Diagnosis not present

## 2016-10-30 DIAGNOSIS — M6281 Muscle weakness (generalized): Secondary | ICD-10-CM | POA: Diagnosis not present

## 2016-10-30 DIAGNOSIS — S42145D Nondisplaced fracture of glenoid cavity of scapula, left shoulder, subsequent encounter for fracture with routine healing: Secondary | ICD-10-CM | POA: Diagnosis not present

## 2016-10-30 DIAGNOSIS — M25652 Stiffness of left hip, not elsewhere classified: Secondary | ICD-10-CM | POA: Diagnosis not present

## 2016-10-30 DIAGNOSIS — R2681 Unsteadiness on feet: Secondary | ICD-10-CM | POA: Diagnosis not present

## 2016-10-30 DIAGNOSIS — Z4789 Encounter for other orthopedic aftercare: Secondary | ICD-10-CM | POA: Diagnosis not present

## 2016-10-30 DIAGNOSIS — I10 Essential (primary) hypertension: Secondary | ICD-10-CM | POA: Diagnosis not present

## 2016-10-30 DIAGNOSIS — Z9181 History of falling: Secondary | ICD-10-CM | POA: Diagnosis not present

## 2016-10-30 DIAGNOSIS — M7989 Other specified soft tissue disorders: Secondary | ICD-10-CM | POA: Diagnosis not present

## 2016-10-30 DIAGNOSIS — M25552 Pain in left hip: Secondary | ICD-10-CM | POA: Diagnosis not present

## 2016-10-30 DIAGNOSIS — W07XXXD Fall from chair, subsequent encounter: Secondary | ICD-10-CM | POA: Diagnosis not present

## 2016-12-01 ENCOUNTER — Other Ambulatory Visit: Payer: Self-pay | Admitting: Internal Medicine

## 2017-05-05 DIAGNOSIS — Z961 Presence of intraocular lens: Secondary | ICD-10-CM | POA: Diagnosis not present

## 2017-05-05 DIAGNOSIS — Z947 Corneal transplant status: Secondary | ICD-10-CM | POA: Diagnosis not present

## 2017-05-05 DIAGNOSIS — H1851 Endothelial corneal dystrophy: Secondary | ICD-10-CM | POA: Diagnosis not present

## 2017-05-05 DIAGNOSIS — H401133 Primary open-angle glaucoma, bilateral, severe stage: Secondary | ICD-10-CM | POA: Diagnosis not present

## 2017-06-01 HISTORY — PX: HIP CLOSED REDUCTION: SHX983

## 2017-08-21 DIAGNOSIS — H16001 Unspecified corneal ulcer, right eye: Secondary | ICD-10-CM | POA: Diagnosis not present

## 2017-08-23 DIAGNOSIS — Z947 Corneal transplant status: Secondary | ICD-10-CM | POA: Diagnosis not present

## 2017-08-23 DIAGNOSIS — Z961 Presence of intraocular lens: Secondary | ICD-10-CM | POA: Diagnosis not present

## 2017-08-23 DIAGNOSIS — H1851 Endothelial corneal dystrophy: Secondary | ICD-10-CM | POA: Diagnosis not present

## 2017-09-01 DIAGNOSIS — H1851 Endothelial corneal dystrophy: Secondary | ICD-10-CM | POA: Diagnosis not present

## 2017-09-01 DIAGNOSIS — Z961 Presence of intraocular lens: Secondary | ICD-10-CM | POA: Diagnosis not present

## 2017-09-01 DIAGNOSIS — Z947 Corneal transplant status: Secondary | ICD-10-CM | POA: Diagnosis not present

## 2017-09-01 DIAGNOSIS — Z8669 Personal history of other diseases of the nervous system and sense organs: Secondary | ICD-10-CM | POA: Diagnosis not present

## 2017-09-20 DIAGNOSIS — Z961 Presence of intraocular lens: Secondary | ICD-10-CM | POA: Diagnosis not present

## 2017-09-20 DIAGNOSIS — Z947 Corneal transplant status: Secondary | ICD-10-CM | POA: Diagnosis not present

## 2017-09-20 DIAGNOSIS — H1851 Endothelial corneal dystrophy: Secondary | ICD-10-CM | POA: Diagnosis not present

## 2017-09-20 DIAGNOSIS — Z8669 Personal history of other diseases of the nervous system and sense organs: Secondary | ICD-10-CM | POA: Diagnosis not present

## 2017-09-22 ENCOUNTER — Encounter: Payer: Self-pay | Admitting: Internal Medicine

## 2017-09-22 ENCOUNTER — Other Ambulatory Visit (INDEPENDENT_AMBULATORY_CARE_PROVIDER_SITE_OTHER): Payer: PPO

## 2017-09-22 ENCOUNTER — Ambulatory Visit (INDEPENDENT_AMBULATORY_CARE_PROVIDER_SITE_OTHER): Payer: PPO | Admitting: Internal Medicine

## 2017-09-22 VITALS — BP 120/76 | HR 81 | Temp 98.0°F | Ht 59.0 in | Wt 107.0 lb

## 2017-09-22 DIAGNOSIS — M109 Gout, unspecified: Secondary | ICD-10-CM | POA: Diagnosis not present

## 2017-09-22 DIAGNOSIS — R739 Hyperglycemia, unspecified: Secondary | ICD-10-CM | POA: Diagnosis not present

## 2017-09-22 DIAGNOSIS — E538 Deficiency of other specified B group vitamins: Secondary | ICD-10-CM

## 2017-09-22 DIAGNOSIS — R269 Unspecified abnormalities of gait and mobility: Secondary | ICD-10-CM | POA: Diagnosis not present

## 2017-09-22 DIAGNOSIS — R202 Paresthesia of skin: Secondary | ICD-10-CM

## 2017-09-22 DIAGNOSIS — N259 Disorder resulting from impaired renal tubular function, unspecified: Secondary | ICD-10-CM

## 2017-09-22 DIAGNOSIS — I129 Hypertensive chronic kidney disease with stage 1 through stage 4 chronic kidney disease, or unspecified chronic kidney disease: Secondary | ICD-10-CM

## 2017-09-22 LAB — CBC WITH DIFFERENTIAL/PLATELET
BASOS ABS: 0 10*3/uL (ref 0.0–0.1)
Basophils Relative: 0.6 % (ref 0.0–3.0)
Eosinophils Absolute: 0.5 10*3/uL (ref 0.0–0.7)
Eosinophils Relative: 6.5 % — ABNORMAL HIGH (ref 0.0–5.0)
HCT: 45.4 % (ref 36.0–46.0)
Hemoglobin: 14.9 g/dL (ref 12.0–15.0)
LYMPHS ABS: 2.6 10*3/uL (ref 0.7–4.0)
Lymphocytes Relative: 37.9 % (ref 12.0–46.0)
MCHC: 32.8 g/dL (ref 30.0–36.0)
MCV: 90 fl (ref 78.0–100.0)
MONO ABS: 0.7 10*3/uL (ref 0.1–1.0)
MONOS PCT: 10.6 % (ref 3.0–12.0)
NEUTROS PCT: 44.4 % (ref 43.0–77.0)
Neutro Abs: 3.1 10*3/uL (ref 1.4–7.7)
Platelets: 208 10*3/uL (ref 150.0–400.0)
RBC: 5.05 Mil/uL (ref 3.87–5.11)
RDW: 13.8 % (ref 11.5–15.5)
WBC: 7 10*3/uL (ref 4.0–10.5)

## 2017-09-22 LAB — HEPATIC FUNCTION PANEL
ALT: 10 U/L (ref 0–35)
AST: 20 U/L (ref 0–37)
Albumin: 3.7 g/dL (ref 3.5–5.2)
Alkaline Phosphatase: 75 U/L (ref 39–117)
BILIRUBIN DIRECT: 0.1 mg/dL (ref 0.0–0.3)
BILIRUBIN TOTAL: 0.4 mg/dL (ref 0.2–1.2)
Total Protein: 6.7 g/dL (ref 6.0–8.3)

## 2017-09-22 LAB — BASIC METABOLIC PANEL
BUN: 26 mg/dL — AB (ref 6–23)
CO2: 27 meq/L (ref 19–32)
Calcium: 9.3 mg/dL (ref 8.4–10.5)
Chloride: 105 mEq/L (ref 96–112)
Creatinine, Ser: 1.6 mg/dL — ABNORMAL HIGH (ref 0.40–1.20)
GFR: 31.5 mL/min — ABNORMAL LOW (ref >60.00)
GLUCOSE: 97 mg/dL (ref 70–99)
POTASSIUM: 4.7 meq/L (ref 3.5–5.1)
Sodium: 137 mEq/L (ref 135–145)

## 2017-09-22 LAB — TSH: TSH: 3.17 u[IU]/mL (ref 0.35–4.50)

## 2017-09-22 LAB — HEMOGLOBIN A1C: Hgb A1c MFr Bld: 5.7 % (ref 4.6–6.5)

## 2017-09-22 LAB — VITAMIN B12: VITAMIN B 12: 1323 pg/mL — AB (ref 211–911)

## 2017-09-22 LAB — URIC ACID: URIC ACID, SERUM: 7.8 mg/dL — AB (ref 2.4–7.0)

## 2017-09-22 MED ORDER — AMLODIPINE-OLMESARTAN 5-40 MG PO TABS: 1.0000 | ORAL_TABLET | Freq: Every day | ORAL | 3 refills | Status: DC

## 2017-09-22 NOTE — Assessment & Plan Note (Signed)
Walker S/p hip fx

## 2017-09-22 NOTE — Assessment & Plan Note (Signed)
Amlod- olmesartan Labs

## 2017-09-22 NOTE — Assessment & Plan Note (Signed)
On B12 Labs 

## 2017-09-22 NOTE — Progress Notes (Signed)
Subjective:  Patient ID: Mckenzie Young, female    DOB: Dec 30, 1915  Age: [age] y.o. MRN: [id number]  CC: No chief complaint on file.   HPI [name] presents for HTN, [place] - [place] well. The Pt turned 89.   Outpatient Medications Prior to Visit  Medication Sig Dispense Refill  . amLODipine-olmesartan (AZOR) 5-40 MG tablet Take 1 tablet by mouth daily. 90 tablet 2  . brimonidine (ALPHAGAN) 0.2 % ophthalmic solution Place into both eyes.    . Cholecalciferol 1000 UNITS tablet Take 1,000 Units by mouth daily.      . Cyanocobalamin (VITAMIN B-12) 500 MCG SUBL 1 tab SL qod 100 tablet 3  . dorzolamide-timolol (COSOPT) 22.3-6.8 MG/ML ophthalmic solution Place into both eyes.    . Fluticasone Furoate-Vilanterol (BREO ELLIPTA) 100-25 MCG/INH AEPB Inhale 1 puff into the lungs daily. 1 each 5  . meloxicam (MOBIC) 7.5 MG tablet TAKE 1 TABLET BY MOUTH DAILY FOR GOUT 30 tablet 2  . omeprazole (PRILOSEC) 20 MG capsule Take 20 mg by mouth daily.      . prednisoLONE acetate (PRED FORTE) 1 % ophthalmic suspension Place into both eyes.    Marland Kitchen. saccharomyces boulardii (FLORASTOR) 250 MG capsule Take 1 capsule (250 mg total) by mouth 2 (two) times daily. 60 capsule 2   No facility-administered medications prior to visit.     ROS Review of Systems  Constitutional: Negative for activity change, appetite change, chills, fatigue and unexpected weight change.  HENT: Positive for hearing loss. Negative for congestion, mouth sores and sinus pressure.   Eyes: Positive for visual disturbance.  Respiratory: Negative for cough and chest tightness.   Gastrointestinal: Negative for abdominal pain and nausea.  Genitourinary: Negative for difficulty urinating, frequency and vaginal pain.  Musculoskeletal: Positive for gait problem. Negative for back pain.  Skin: Negative for pallor and rash.  Neurological: Negative for dizziness, tremors, weakness, numbness and headaches.    Psychiatric/Behavioral: Negative for confusion, sleep disturbance and suicidal ideas.    Objective:  BP 120/76 (BP Location: Right Arm, Patient Position: Sitting, Cuff Size: Normal)   Pulse 81   Temp 98 F (36.7 C) (Oral)   Ht 4\' 11"  (1.499 m)   Wt 107 lb (48.5 kg)   SpO2 97%   BMI 21.61 kg/m   BP Readings from Last 3 Encounters:  09/22/17 120/76  04/16/16 120/70  10/15/15 130/88    Wt Readings from Last 3 Encounters:  09/22/17 107 lb (48.5 kg)  04/16/16 114 lb (51.7 kg)  10/15/15 112 lb (50.8 kg)    Physical Exam  Constitutional: She appears well-developed. No distress.  HENT:  Head: Normocephalic.  Right Ear: External ear normal.  Left Ear: External ear normal.  Nose: Nose normal.  Mouth/Throat: Oropharynx is clear and moist.  Eyes: Pupils are equal, round, and reactive to light. Conjunctivae are normal. Right eye exhibits no discharge. Left eye exhibits no discharge.  Neck: Normal range of motion. Neck supple. No JVD present. No tracheal deviation present. No thyromegaly present.  Cardiovascular: Normal rate, regular rhythm and normal heart sounds.  Pulmonary/Chest: No stridor. No respiratory distress. She has no wheezes.  Abdominal: Soft. Bowel sounds are normal. She exhibits no distension and no mass. There is no tenderness. There is no rebound and no guarding.  Musculoskeletal: She exhibits no edema or tenderness.  Lymphadenopathy:    She has no cervical adenopathy.  Neurological: She displays normal reflexes. No cranial nerve deficit. She exhibits normal muscle tone.  Coordination abnormal.  Skin: No rash noted. No erythema.  Psychiatric: She has a normal mood and affect. Her behavior is normal. Judgment and thought content normal.   Ataxic Walker   Lab Results  Component Value Date   WBC 6.2 10/10/2013   HGB 14.3 10/10/2013   HCT 43.5 10/10/2013   PLT 202.0 10/10/2013   GLUCOSE 89 10/15/2015   CHOL 214 (H) 10/10/2013   TRIG 246.0 (H) 10/10/2013    HDL 46.10 10/10/2013   LDLCALC 119 (H) 10/10/2013   ALT 9 10/15/2015   AST 17 10/15/2015   NA 140 10/15/2015   K 4.3 10/15/2015   CL 107 10/15/2015   CREATININE 1.63 (H) 10/15/2015   BUN 24 (H) 10/15/2015   CO2 26 10/15/2015   TSH 2.56 10/17/2014   HGBA1C 5.6 10/10/2013    Dg Chest 2 View  Result Date: 07/04/2015 CLINICAL DATA:  Dry cough.  Hypertension. EXAM: CHEST  2 VIEW COMPARISON:  No prior available. FINDINGS: Mediastinum and hilar structures are normal . Borderline cardiomegaly. Mild infiltrate right lung base cannot be excluded. No pleural effusion or pneumothorax. Small sliding hiatal hernia cannot be excluded. No acute bony abnormality. Surgical clips right upper quadrant. IMPRESSION: 1.  Mild infiltrate right lung base cannot be excluded. 2.  Borderline cardiomegaly.  No CHF. 3.  Probable small sliding hiatal hernia. Electronically Signed   By: Maisie Fus  Register   On: 07/04/2015 10:00    Assessment & Plan:   There are no diagnoses linked to this encounter. I have discontinued [name] saccharomyces boulardii. I am also having her maintain her omeprazole, Cholecalciferol, brimonidine, dorzolamide-timolol, prednisoLONE acetate, fluticasone furoate-vilanterol, Vitamin B-12, meloxicam, and amLODipine-olmesartan.  No orders of the defined types were placed in this encounter.    Follow-up: No follow-ups on file.  Sonda Primes, MD

## 2017-10-13 DIAGNOSIS — Z961 Presence of intraocular lens: Secondary | ICD-10-CM | POA: Diagnosis not present

## 2017-10-13 DIAGNOSIS — Z947 Corneal transplant status: Secondary | ICD-10-CM | POA: Diagnosis not present

## 2017-10-13 DIAGNOSIS — Z8669 Personal history of other diseases of the nervous system and sense organs: Secondary | ICD-10-CM | POA: Diagnosis not present

## 2017-10-13 DIAGNOSIS — H1851 Endothelial corneal dystrophy: Secondary | ICD-10-CM | POA: Diagnosis not present

## 2018-03-10 ENCOUNTER — Other Ambulatory Visit: Payer: Self-pay

## 2018-03-10 ENCOUNTER — Encounter (HOSPITAL_COMMUNITY): Payer: Self-pay | Admitting: Emergency Medicine

## 2018-03-10 ENCOUNTER — Emergency Department (HOSPITAL_COMMUNITY)
Admission: EM | Admit: 2018-03-10 | Discharge: 2018-03-11 | Disposition: A | Discharge status: Home / Self Care | Payer: PPO | Attending: Emergency Medicine | Admitting: Emergency Medicine

## 2018-03-10 ENCOUNTER — Emergency Department (HOSPITAL_COMMUNITY): Payer: PPO

## 2018-03-10 DIAGNOSIS — Y92003 Bedroom of unspecified non-institutional (private) residence as the place of occurrence of the external cause: Secondary | ICD-10-CM | POA: Insufficient documentation

## 2018-03-10 DIAGNOSIS — I129 Hypertensive chronic kidney disease with stage 1 through stage 4 chronic kidney disease, or unspecified chronic kidney disease: Secondary | ICD-10-CM | POA: Insufficient documentation

## 2018-03-10 DIAGNOSIS — W19XXXA Unspecified fall, initial encounter: Secondary | ICD-10-CM | POA: Diagnosis not present

## 2018-03-10 DIAGNOSIS — Y999 Unspecified external cause status: Secondary | ICD-10-CM | POA: Diagnosis not present

## 2018-03-10 DIAGNOSIS — S01411A Laceration without foreign body of right cheek and temporomandibular area, initial encounter: Secondary | ICD-10-CM | POA: Diagnosis not present

## 2018-03-10 DIAGNOSIS — Z23 Encounter for immunization: Secondary | ICD-10-CM | POA: Diagnosis not present

## 2018-03-10 DIAGNOSIS — H409 Unspecified glaucoma: Secondary | ICD-10-CM | POA: Diagnosis not present

## 2018-03-10 DIAGNOSIS — W06XXXA Fall from bed, initial encounter: Secondary | ICD-10-CM | POA: Diagnosis not present

## 2018-03-10 DIAGNOSIS — Z79899 Other long term (current) drug therapy: Secondary | ICD-10-CM | POA: Insufficient documentation

## 2018-03-10 DIAGNOSIS — S0181XA Laceration without foreign body of other part of head, initial encounter: Secondary | ICD-10-CM

## 2018-03-10 DIAGNOSIS — S0003XA Contusion of scalp, initial encounter: Secondary | ICD-10-CM | POA: Diagnosis not present

## 2018-03-10 DIAGNOSIS — S199XXA Unspecified injury of neck, initial encounter: Secondary | ICD-10-CM | POA: Diagnosis not present

## 2018-03-10 DIAGNOSIS — R0902 Hypoxemia: Secondary | ICD-10-CM | POA: Diagnosis not present

## 2018-03-10 DIAGNOSIS — R58 Hemorrhage, not elsewhere classified: Secondary | ICD-10-CM | POA: Diagnosis not present

## 2018-03-10 DIAGNOSIS — S0990XA Unspecified injury of head, initial encounter: Secondary | ICD-10-CM | POA: Diagnosis present

## 2018-03-10 DIAGNOSIS — Y9384 Activity, sleeping: Secondary | ICD-10-CM | POA: Diagnosis not present

## 2018-03-10 DIAGNOSIS — I10 Essential (primary) hypertension: Secondary | ICD-10-CM | POA: Diagnosis not present

## 2018-03-10 DIAGNOSIS — S0083XA Contusion of other part of head, initial encounter: Secondary | ICD-10-CM | POA: Diagnosis not present

## 2018-03-10 DIAGNOSIS — N189 Chronic kidney disease, unspecified: Secondary | ICD-10-CM | POA: Insufficient documentation

## 2018-03-10 MED ORDER — TETANUS-DIPHTH-ACELL PERTUSSIS 5-2.5-18.5 LF-MCG/0.5 IM SUSP
0.5000 mL | Freq: Once | INTRAMUSCULAR | Status: AC
  Administered 2018-03-11: 0.5 mL via INTRAMUSCULAR
  Filled 2018-03-10: qty 0.5

## 2018-03-10 NOTE — ED Triage Notes (Signed)
Pt from home lives with family members reported to have fallen from bed to carpeted floor striking head against nightstand causing hematoma and laceration at right eyebrow. Bleeding controlled denies taking blood thinners denies LOC, currently alert ton norm A&O x4.

## 2018-03-10 NOTE — ED Provider Notes (Signed)
[facility]-EMERGENCY DEPT Provider Note   CSN: [id number] Arrival date & time: 03/10/18  2223     History   Chief Complaint Chief Complaint  Patient presents with  . Fall    HPI Mckenzie Young is a 84 y.o. female.  The history is provided by the patient.  Fall      84 y.o. F with hx of DM, glaucoma, OA, presenting to the ED after a fall.  Patient states she was in bed and tried to lift her 4 lb poodle onto the bed when he ran away, she reached further and rolled off the bed.  She struck the right side of her head/face on the side of her night table.  She denies LOC.  States she only has pain in the right side of her face.  She denies arm or leg pain.  No neck or back pain.  States she is fairly healthy, only sees her doctor once a year.  No recent illness, fever, or chills.  Unsure of last tetanus vaccine.  Past Medical History:  Diagnosis Date  . Esophageal stricture    Dr Velna Ochs  . Glaucoma   . Hypertension   . Osteoarthritis   . Renal insufficiency     Patient Active Problem List   Diagnosis Date Noted  . Asthmatic bronchitis 07/04/2015  . Mass of left ear 04/23/2015  . Paresthesia 09/23/2012  . B12 deficiency 09/23/2012  . Finger pain, right 03/25/2011  . Gout 03/25/2011  . Ankle sprain 09/04/2010  . Disorder resulting from impaired renal function 09/27/2009  . OTHER ABNORMAL GLUCOSE 04/17/2009  . Abnormality of gait 01/15/2009  . GLAUCOMA NOS 09/05/2008  . ESOPHAGEAL STRICTURE, LAST DILITATION 05/31/00 09/05/2008  . Hypertensive renal failure 05/23/2007  . OSTEOARTHRITIS 05/23/2007  . ARM PAIN 05/23/2007    Past Surgical History:  Procedure Laterality Date  . CATARACT EXTRACTION    . HIP CLOSED REDUCTION Left 2019     OB History   None      Home Medications    Prior to Admission medications   Medication Sig Start Date End Date Taking? Authorizing Provider  amLODipine-olmesartan (AZOR) 5-40 MG tablet Take 1 tablet by  mouth daily. 09/22/17   Plotnikov, Georgina Quint, MD  brimonidine (ALPHAGAN) 0.2 % ophthalmic solution Place into both eyes. 08/20/14   [provider]  Cholecalciferol 1000 UNITS tablet Take 1,000 Units by mouth daily.      [provider]  Cyanocobalamin (VITAMIN B-12) 500 MCG SUBL 1 tab SL qod 04/16/16   Plotnikov, Georgina Quint, MD  dorzolamide-timolol (COSOPT) 22.3-6.8 MG/ML ophthalmic solution Place into both eyes. 09/05/14   [provider]  Fluticasone Furoate-Vilanterol (BREO ELLIPTA) 100-25 MCG/INH AEPB Inhale 1 puff into the lungs daily. 07/04/15   Plotnikov, Georgina Quint, MD  prednisoLONE acetate (PRED FORTE) 1 % ophthalmic suspension Place into both eyes. 09/05/14   [provider]    Family History Family History  Problem Relation Age of Onset  . Hypertension Other     Social History Social History   Tobacco Use  . Smoking status: Never Smoker  . Smokeless tobacco: Never Used  Substance Use Topics  . Alcohol use: No  . Drug use: No     Allergies   Patient has no known allergies.   Review of Systems Review of Systems  Skin: Positive for wound.  All other systems reviewed and are negative.    Physical Exam Updated Vital Signs BP (!) 157/81 (BP Location:  Left Arm)   Pulse 73   Temp (!) 97.5 F (36.4 C) (Oral)   Resp 15   SpO2 93%   Physical Exam  Constitutional: She is oriented to person, place, and time. She appears well-developed and well-nourished.  HENT:  Head: Normocephalic and atraumatic.  Mouth/Throat: Oropharynx is clear and moist.  Hematoma to right forehead/temple area; small 0.5 cm superficial laceration to right upper cheek, does have some area of skin tear/avulsion surrounding, no active bleeding; no deformity; mid-face stable; wearing dentures  Eyes: Pupils are equal, round, and reactive to light. Conjunctivae and EOM are normal.  Neck: Normal range of motion.  Cardiovascular: Normal rate, regular rhythm and normal heart  sounds.  Pulmonary/Chest: Effort normal and breath sounds normal.  Abdominal: Soft. Bowel sounds are normal.  Musculoskeletal: Normal range of motion.  Neurological: She is alert and oriented to person, place, and time.  Awake, alert, oriented x3; moving arms and legs well, speech is clear and concise  Skin: Skin is warm and dry.  Psychiatric: She has a normal mood and affect.  Nursing note and vitals reviewed.    ED Treatments / Results  Labs (all labs ordered are listed, but only abnormal results are displayed) Labs Reviewed - No data to display  EKG None  Radiology Ct Head Wo Contrast  Result Date: 03/10/2018 CLINICAL DATA:  Patient fell, striking the right side of her head. EXAM: CT HEAD WITHOUT CONTRAST CT CERVICAL SPINE WITHOUT CONTRAST TECHNIQUE: Multidetector CT imaging of the head and cervical spine was performed following the standard protocol without intravenous contrast. Multiplanar CT image reconstructions of the cervical spine were also generated. COMPARISON:  None. FINDINGS: CT HEAD FINDINGS Brain: Diffuse cerebral atrophy. Mild ventricular dilatation consistent with central atrophy. Low-attenuation changes in the deep white matter consistent with small vessel ischemia. No mass effect or midline shift. No abnormal extra-axial fluid collections. Gray-white matter junctions are distinct. Basal cisterns are not effaced. No acute intracranial hemorrhage. Vascular: Moderate intracranial arterial vascular calcifications. Skull: Calvarium appears intact. No acute depressed skull fractures. Sinuses/Orbits: Paranasal sinuses and mastoid air cells are clear. Old appearing nasal bone deformities. Other: Subcutaneous scalp hematoma over the right frontotemporal region. CT CERVICAL SPINE FINDINGS Alignment: Straightening of usual cervical lordosis. This is probably due to patient positioning or degenerative change but ligamentous injury or muscle spasm could also have this appearance and  are not excluded. No anterior subluxation. Normal alignment of the facet joints. Skull base and vertebrae: Skull base appears intact. Degenerative changes at the C1-2 level with some basilar invagination. Soft tissue calcifications about the odontoid process probably represent ligamentous calcification although avulsion injury is not excluded. No vertebral compression deformities. No focal bone lesion or bone destruction. Bone cortex appears intact. Soft tissues and spinal canal: No prevertebral soft tissue swelling. No abnormal paraspinal soft tissue mass or infiltration. Disc levels: Degenerative changes throughout the cervical spine with narrowed interspaces and endplate hypertrophic changes. Degenerative changes throughout the facet joints. Upper chest: Lung apices are clear. Prominent aortic and vascular calcifications. Other: None. IMPRESSION: 1. No acute intracranial abnormalities. Diffuse atrophy and small vessel ischemic changes. 2. Subcutaneous scalp hematoma over the right frontotemporal region. 3. Nonspecific straightening of usual cervical lordosis. Prominent degenerative changes at C1-2. Degenerative changes throughout the cervical spine. No acute displaced fractures identified in the cervical spine. Electronically Signed   By: Burman Nieves M.D.   On: 03/10/2018 23:43   Ct Cervical Spine Wo Contrast  Result Date: 03/10/2018 CLINICAL DATA:  Patient  fell, striking the right side of her head. EXAM: CT HEAD WITHOUT CONTRAST CT CERVICAL SPINE WITHOUT CONTRAST TECHNIQUE: Multidetector CT imaging of the head and cervical spine was performed following the standard protocol without intravenous contrast. Multiplanar CT image reconstructions of the cervical spine were also generated. COMPARISON:  None. FINDINGS: CT HEAD FINDINGS Brain: Diffuse cerebral atrophy. Mild ventricular dilatation consistent with central atrophy. Low-attenuation changes in the deep white matter consistent with small vessel  ischemia. No mass effect or midline shift. No abnormal extra-axial fluid collections. Gray-white matter junctions are distinct. Basal cisterns are not effaced. No acute intracranial hemorrhage. Vascular: Moderate intracranial arterial vascular calcifications. Skull: Calvarium appears intact. No acute depressed skull fractures. Sinuses/Orbits: Paranasal sinuses and mastoid air cells are clear. Old appearing nasal bone deformities. Other: Subcutaneous scalp hematoma over the right frontotemporal region. CT CERVICAL SPINE FINDINGS Alignment: Straightening of usual cervical lordosis. This is probably due to patient positioning or degenerative change but ligamentous injury or muscle spasm could also have this appearance and are not excluded. No anterior subluxation. Normal alignment of the facet joints. Skull base and vertebrae: Skull base appears intact. Degenerative changes at the C1-2 level with some basilar invagination. Soft tissue calcifications about the odontoid process probably represent ligamentous calcification although avulsion injury is not excluded. No vertebral compression deformities. No focal bone lesion or bone destruction. Bone cortex appears intact. Soft tissues and spinal canal: No prevertebral soft tissue swelling. No abnormal paraspinal soft tissue mass or infiltration. Disc levels: Degenerative changes throughout the cervical spine with narrowed interspaces and endplate hypertrophic changes. Degenerative changes throughout the facet joints. Upper chest: Lung apices are clear. Prominent aortic and vascular calcifications. Other: None. IMPRESSION: 1. No acute intracranial abnormalities. Diffuse atrophy and small vessel ischemic changes. 2. Subcutaneous scalp hematoma over the right frontotemporal region. 3. Nonspecific straightening of usual cervical lordosis. Prominent degenerative changes at C1-2. Degenerative changes throughout the cervical spine. No acute displaced fractures identified in the  cervical spine. Electronically Signed   By: Burman Nieves M.D.   On: 03/10/2018 23:43    Procedures Procedures (including critical care time)  LACERATION REPAIR Performed by: Garlon Hatchet Authorized by: Garlon Hatchet Consent: Verbal consent obtained. Risks and benefits: risks, benefits and alternatives were discussed Consent given by: patient Patient identity confirmed: provided demographic data Prepped and Draped in normal sterile fashion Wound explored  Laceration Location: right upper cheek  Laceration Length: 0.5 cm, superficial  No Foreign Bodies seen or palpated  Anesthesia: none  Local anesthetic: none  Anesthetic total: 0 ml  Irrigation method: syringe Amount of cleaning: standard  Skin closure: steri-strips  Number of sutures: 0  Technique: n/a  Patient tolerance: Patient tolerated the procedure well with no immediate complications.   Medications Ordered in ED Medications  Tdap (BOOSTRIX) injection 0.5 mL (0.5 mLs Intramuscular Given 03/11/18 0119)     Initial Impression / Assessment and Plan / ED Course  I have reviewed the triage vital signs and the nursing notes.  Pertinent labs & imaging results that were available during my care of the patient were reviewed by me and considered in my medical decision making (see chart for details).  84 year old female presenting to the ED after a fall.  She was lying in bed and leaned over to pick up her poodle to put in the bed, leaned too far and fell off the bed.  She struck the right side of her face against her nightstand.  Denies any loss of consciousness.  She  is awake, alert, fully oriented.  She has hematoma to her right forehead/temple and small, superficial laceration to right upper cheek with surrounding skin tear.  There is no active bleeding.  denies any other injuries or areas of pain. Date of last tetanus unknown.  She is not currently on anticoagulation.  Given her age, will obtain CT of head  and neck.  Tetanus will be updated.  CT imaging negative for acute traumatic injuries.  Laceration is very small and superficial, was cleansed and approximated with Steri-Strips.  She remained without any bleeding.  I discussed continued home wound care with her daughter, instructed how to change Steri-Strips should they fall off and change dressings.  They were given supplies.  She can follow-up closely with her primary care doctor.  She will return here for any new or acute changes.  Final Clinical Impressions(s) / ED Diagnoses   Final diagnoses:  Fall from bed, initial encounter  Facial laceration, initial encounter    ED Discharge Orders    None       Garlon Hatchet, PA-C 03/11/18 0157    Wynetta Fines, MD 03/14/18 1440

## 2018-03-10 NOTE — ED Notes (Signed)
Bed: ON62 Expected date:  Expected time:  Means of arrival:  Comments: 85 yo F fall- no blood thinners.

## 2018-03-11 NOTE — Discharge Instructions (Signed)
Keep wound covered for the next 48 hours or so. Can place new steri-strips if the ones from today happen to fall off. Keep face clean with soap and warm water. Return here for any new/acute changes.

## 2018-05-04 DIAGNOSIS — Z961 Presence of intraocular lens: Secondary | ICD-10-CM | POA: Diagnosis not present

## 2018-05-04 DIAGNOSIS — H1851 Endothelial corneal dystrophy: Secondary | ICD-10-CM | POA: Diagnosis not present

## 2018-05-04 DIAGNOSIS — H401133 Primary open-angle glaucoma, bilateral, severe stage: Secondary | ICD-10-CM | POA: Diagnosis not present

## 2018-05-04 DIAGNOSIS — Z947 Corneal transplant status: Secondary | ICD-10-CM | POA: Diagnosis not present

## 2018-05-04 DIAGNOSIS — Z8669 Personal history of other diseases of the nervous system and sense organs: Secondary | ICD-10-CM | POA: Diagnosis not present

## 2018-06-13 ENCOUNTER — Ambulatory Visit: Payer: PPO | Admitting: Podiatry

## 2018-06-13 ENCOUNTER — Encounter: Payer: Self-pay | Admitting: Podiatry

## 2018-06-13 VITALS — BP 107/59

## 2018-06-13 DIAGNOSIS — B351 Tinea unguium: Secondary | ICD-10-CM | POA: Diagnosis not present

## 2018-06-13 DIAGNOSIS — M79674 Pain in right toe(s): Secondary | ICD-10-CM

## 2018-06-13 DIAGNOSIS — M79675 Pain in left toe(s): Secondary | ICD-10-CM

## 2018-06-13 NOTE — Patient Instructions (Signed)

## 2018-06-14 NOTE — Progress Notes (Signed)
Subjective: Mckenzie Young presents accompanied by her daughter today and referred by Plotnikov, Georgina Quint, MD for evaluation and treatment of painful, discolored, thick toenails of both feet which interfere with daily activities.  Pain is aggravated when wearing enclosed shoe gear.   Most painful today is her right great toe which has the toenail of the right second toe digging into her skin.  Past Medical History:  Diagnosis Date  . Esophageal stricture    Dr Velna Ochs  . Glaucoma   . Hypertension   . Osteoarthritis   . Renal insufficiency      Patient Active Problem List   Diagnosis Date Noted  . Asthmatic bronchitis 07/04/2015  . Mass of left ear 04/23/2015  . Status post corneal transplant 05/17/2013  . Paresthesia 09/23/2012  . B12 deficiency 09/23/2012  . Hypertension 03/14/2012  . Severe stage glaucoma 04/19/2011  . Primary open angle glaucoma 04/19/2011  . Finger pain, right 03/25/2011  . Gout 03/25/2011  . Fuchs' corneal dystrophy 03/13/2011  . Ankle sprain 09/04/2010  . Disorder resulting from impaired renal function 09/27/2009  . OTHER ABNORMAL GLUCOSE 04/17/2009  . Abnormality of gait 01/15/2009  . GLAUCOMA NOS 09/05/2008  . ESOPHAGEAL STRICTURE, LAST DILITATION 05/31/00 09/05/2008  . Hypertensive renal failure 05/23/2007  . OSTEOARTHRITIS 05/23/2007  . ARM PAIN 05/23/2007     Past Surgical History:  Procedure Laterality Date  . CATARACT EXTRACTION    . HIP CLOSED REDUCTION Left 2019      Current Outpatient Medications:  .  amLODipine-olmesartan (AZOR) 5-40 MG tablet, Take 1 tablet by mouth daily., Disp: 90 tablet, Rfl: 3 .  brimonidine (ALPHAGAN) 0.2 % ophthalmic solution, Place into both eyes., Disp: , Rfl:  .  Cholecalciferol 1000 UNITS tablet, Take 1,000 Units by mouth daily.  , Disp: , Rfl:  .  Cyanocobalamin (VITAMIN B-12) 500 MCG SUBL, 1 tab SL qod, Disp: 100 tablet, Rfl: 3 .  dorzolamide-timolol (COSOPT) 22.3-6.8 MG/ML ophthalmic solution, Place  into both eyes., Disp: , Rfl:  .  Fluticasone Furoate-Vilanterol (BREO ELLIPTA) 100-25 MCG/INH AEPB, Inhale 1 puff into the lungs daily. (Patient not taking: Reported on 06/13/2018), Disp: 1 each, Rfl: 5 .  prednisoLONE acetate (PRED FORTE) 1 % ophthalmic suspension, Place into both eyes., Disp: , Rfl:    No Known Allergies   Social History   Occupational History  . Occupation: Retired  Tobacco Use  . Smoking status: Never Smoker  . Smokeless tobacco: Never Used  Substance and Sexual Activity  . Alcohol use: No  . Drug use: No  . Sexual activity: Never     Family History  Problem Relation Age of Onset  . Hypertension Other      Immunization History  Administered Date(s) Administered  . Tdap 03/11/2018     Review of systems: Positive Findings in bold print.  Constitutional:  chills, fatigue, fever, sweats, weight change Communication: Nurse, learning disability, sign Presenter, broadcasting, hand writing, iPad/Android device Head: headaches, head injury Eyes: changes in vision, eye pain, glaucoma, cataracts, macular degeneration, diplopia, glare,  light sensitivity, eyeglasses or contacts, blindness Ears nose mouth throat: Hard of hearing, ringing in ears, deaf, sign language,  vertigo,   nosebleeds,  rhinitis,  cold sores, snoring, swollen glands Cardiovascular: HTN, edema, arrhythmia, pacemaker in place, defibrillator in place,  chest pain/tightness, chronic anticoagulation, blood clot, heart failure Peripheral Vascular: leg cramps, varicose veins, blood clots, lymphedema Respiratory:  difficulty breathing, denies congestion, SOB, wheezing, cough, emphysema Gastrointestinal: change in appetite or weight, abdominal pain, constipation, diarrhea,  nausea, vomiting, vomiting blood, change in bowel habits, abdominal pain, jaundice, rectal bleeding, hemorrhoids, Genitourinary:  nocturia,  pain on urination,  blood in urine, Foley catheter, urinary urgency Musculoskeletal: uses mobility aid,  cramping,  stiff joints, painful joints, decreased joint motion, fractures, OA, gout Skin: +changes in toenails, color change, dryness, itching, mole changes,  rash  Neurological: headaches, numbness in feet, paresthesias in feet, burning in feet, fainting,  seizures, change in speech. denies headaches, memory problems/poor historian, cerebral palsy, weakness, paralysis Endocrine: diabetes, hypothyroidism, hyperthyroidism,  goiter, dry mouth, flushing, heat intolerance,  cold intolerance,  excessive thirst, denies polyuria,  nocturia Hematological:  easy bleeding, excessive bleeding, easy bruising, enlarged lymph nodes, on long term blood thinner, history of past transusions Allergy/immunological:  hives, eczema, frequent infections, multiple drug allergies, seasonal allergies, transplant recipient Psychiatric:  anxiety, depression, mood disorder, suicidal ideations, hallucinations   Objective: Vascular Examination: Capillary refill time immediate x 10 digits Dorsalis pedis and posterior tibial pulses present b/l No digital hair x 10 digits Skin temperature gradient WNL b/l  Dermatological Examination: Pedal integument is noted to be thin, shiny, and atrophic bilaterally.  Toenails 1-5 b/l discolored, thick, dystrophic with subungual debris and pain with palpation to nailbeds due to thickness of nails.  Right second digit toenail is impinging to the skin of the right great toe.  There no breaks in skin, no edema, no erythema, no flocculence noted.  She also has incurvated nail plates of the bilateral great toes there is a small amount of subungual debris noted around the medial and lateral aspects of the digits.  Musculoskeletal: Muscle strength 5/5 to all LE muscle groups  Neurological: Sensation intact with 10 gram monofilament Vibratory sensation intact.  Assessment: 1. Painful onychomycosis toenails 1-5 b/l    Plan: 1. Discussed onychomycosis and treatment options.  Literature dispensed on  today. 2. Toenails 1-5 b/l were debrided in length and girth without iatrogenic bleeding. 3. Patient to continue soft, supportive shoe gear.  Offending nail borders bilateral great toes were debrided and curettaged without incident. 4. Patient to report any pedal injuries to medical professional immediately. 5. Follow up 3 months. Patient/POA to call should there be a concern in the interim.

## 2018-09-13 ENCOUNTER — Ambulatory Visit: Payer: PPO | Admitting: Podiatry

## 2018-09-26 ENCOUNTER — Encounter: Payer: PPO | Admitting: Internal Medicine

## 2018-10-17 ENCOUNTER — Encounter: Payer: Self-pay | Admitting: Podiatry

## 2018-10-17 ENCOUNTER — Other Ambulatory Visit: Payer: Self-pay

## 2018-10-17 ENCOUNTER — Ambulatory Visit: Payer: PPO | Admitting: Podiatry

## 2018-10-17 VITALS — Temp 97.7°F

## 2018-10-17 DIAGNOSIS — M79674 Pain in right toe(s): Secondary | ICD-10-CM | POA: Diagnosis not present

## 2018-10-17 DIAGNOSIS — B351 Tinea unguium: Secondary | ICD-10-CM | POA: Diagnosis not present

## 2018-10-17 DIAGNOSIS — M79675 Pain in left toe(s): Secondary | ICD-10-CM | POA: Diagnosis not present

## 2018-10-17 NOTE — Patient Instructions (Signed)

## 2018-10-24 ENCOUNTER — Encounter: Payer: Self-pay | Admitting: Podiatry

## 2018-10-24 NOTE — Progress Notes (Signed)
Subjective:  Mckenzie Young presents to clinic today with cc of  painful, thick, discolored, elongated toenails 1-5 b/l that become tender and cannot cut because of thickness.  Pain is aggravated when wearing enclosed shoe gear.  Plotnikov, Georgina Quint, MD is her PCP.    Current Outpatient Medications:  .  amLODipine-olmesartan (AZOR) 5-40 MG tablet, Take 1 tablet by mouth daily., Disp: 90 tablet, Rfl: 3 .  brimonidine (ALPHAGAN) 0.2 % ophthalmic solution, Place into both eyes., Disp: , Rfl:  .  Cholecalciferol 1000 UNITS tablet, Take 1,000 Units by mouth daily.  , Disp: , Rfl:  .  Cyanocobalamin (VITAMIN B-12) 500 MCG SUBL, 1 tab SL qod, Disp: 100 tablet, Rfl: 3 .  dorzolamide-timolol (COSOPT) 22.3-6.8 MG/ML ophthalmic solution, Place into both eyes., Disp: , Rfl:  .  Fluticasone Furoate-Vilanterol (BREO ELLIPTA) 100-25 MCG/INH AEPB, Inhale 1 puff into the lungs daily., Disp: 1 each, Rfl: 5 .  prednisoLONE acetate (PRED FORTE) 1 % ophthalmic suspension, Place into both eyes., Disp: , Rfl:    No Known Allergies   Objective: Vitals:   10/17/18 1510  Temp: 97.7 F (36.5 C)    Physical Examination: Vitals:   10/17/18 1510  Temp: 97.7 F (36.5 C)   Vascular Examination: Capillary refill time immediate x 10 digits.  DP/PT pulses palpable b/l.  Digital hair absent b/l.  No edema noted b/l.  Skin temperature gradient WNL b/l.  Dermatological Examination: Skin is noted to be thin, atrophic and shiny b/l.  No open wounds b/l.  No interdigital macerations noted b/l.  Elongated, thick, discolored brittle toenails with subungual debris and pain on dorsal palpation of nailbeds 1-5 b/l.  Musculoskeletal Examination: Muscle strength 5/5 to all muscle groups b/l  No pain, crepitus or joint discomfort with active/passive ROM.  Neurological Examination: Sensation intact 5/5 b/l with 10 gram monofilament.  Vibratory sensation intact b/l.  Proprioceptive sensation intact  b/l.  Assessment: Mycotic nail infection with pain 1-5 b/l  Plan: 1. Toenails 1-5 b/l were debrided in length and girth without iatrogenic laceration. 2.  Continue soft, supportive shoe gear daily. 3.  Report any pedal injuries to medical professional. 4.  Follow up 3 months. 5.  Patient/POA to call should there be a question/concern in the interim.

## 2018-12-08 ENCOUNTER — Encounter: Payer: PPO | Admitting: Internal Medicine

## 2018-12-09 ENCOUNTER — Other Ambulatory Visit: Payer: Self-pay | Admitting: Internal Medicine

## 2019-02-08 ENCOUNTER — Ambulatory Visit: Payer: PPO | Admitting: Podiatry

## 2019-02-13 ENCOUNTER — Ambulatory Visit (INDEPENDENT_AMBULATORY_CARE_PROVIDER_SITE_OTHER): Payer: PPO | Admitting: Internal Medicine

## 2019-02-13 ENCOUNTER — Other Ambulatory Visit (INDEPENDENT_AMBULATORY_CARE_PROVIDER_SITE_OTHER): Payer: PPO

## 2019-02-13 ENCOUNTER — Other Ambulatory Visit: Payer: Self-pay

## 2019-02-13 ENCOUNTER — Encounter: Payer: Self-pay | Admitting: Internal Medicine

## 2019-02-13 VITALS — BP 112/76 | HR 86 | Temp 98.1°F | Ht 59.0 in | Wt 103.0 lb

## 2019-02-13 DIAGNOSIS — I10 Essential (primary) hypertension: Secondary | ICD-10-CM

## 2019-02-13 DIAGNOSIS — M1000 Idiopathic gout, unspecified site: Secondary | ICD-10-CM | POA: Diagnosis not present

## 2019-02-13 DIAGNOSIS — R269 Unspecified abnormalities of gait and mobility: Secondary | ICD-10-CM

## 2019-02-13 DIAGNOSIS — E559 Vitamin D deficiency, unspecified: Secondary | ICD-10-CM

## 2019-02-13 DIAGNOSIS — E538 Deficiency of other specified B group vitamins: Secondary | ICD-10-CM

## 2019-02-13 LAB — VITAMIN B12: Vitamin B-12: 1494 pg/mL — ABNORMAL HIGH (ref 211–911)

## 2019-02-13 LAB — CBC WITH DIFFERENTIAL/PLATELET
Basophils Absolute: 0.3 10*3/uL — ABNORMAL HIGH (ref 0.0–0.1)
Basophils Relative: 4.5 % — ABNORMAL HIGH (ref 0.0–3.0)
Eosinophils Absolute: 0.3 10*3/uL (ref 0.0–0.7)
Eosinophils Relative: 5.5 % — ABNORMAL HIGH (ref 0.0–5.0)
HCT: 45 % (ref 36.0–46.0)
Hemoglobin: 14.8 g/dL (ref 12.0–15.0)
Lymphocytes Relative: 34.7 % (ref 12.0–46.0)
Lymphs Abs: 2.2 10*3/uL (ref 0.7–4.0)
MCHC: 33 g/dL (ref 30.0–36.0)
MCV: 88.3 fl (ref 78.0–100.0)
Monocytes Absolute: 0.7 10*3/uL (ref 0.1–1.0)
Monocytes Relative: 11.4 % (ref 3.0–12.0)
Neutro Abs: 2.8 10*3/uL (ref 1.4–7.7)
Neutrophils Relative %: 43.9 % (ref 43.0–77.0)
Platelets: 200 10*3/uL (ref 150.0–400.0)
RBC: 5.1 Mil/uL (ref 3.87–5.11)
RDW: 13.9 % (ref 11.5–15.5)
WBC: 6.3 10*3/uL (ref 4.0–10.5)

## 2019-02-13 LAB — BASIC METABOLIC PANEL
BUN: 26 mg/dL — ABNORMAL HIGH (ref 6–23)
CO2: 21 mEq/L (ref 19–32)
Calcium: 9.2 mg/dL (ref 8.4–10.5)
Chloride: 105 mEq/L (ref 96–112)
Creatinine, Ser: 1.67 mg/dL — ABNORMAL HIGH (ref 0.40–1.20)
GFR: 28.13 mL/min — ABNORMAL LOW (ref >60.00)
Glucose, Bld: 106 mg/dL — ABNORMAL HIGH (ref 70–99)
Potassium: 4.4 mEq/L (ref 3.5–5.1)
Sodium: 137 mEq/L (ref 135–145)

## 2019-02-13 LAB — HEPATIC FUNCTION PANEL
ALT: 6 U/L (ref 0–35)
AST: 15 U/L (ref 0–37)
Albumin: 3.5 g/dL (ref 3.5–5.2)
Alkaline Phosphatase: 66 U/L (ref 39–117)
Bilirubin, Direct: 0 mg/dL (ref 0.0–0.3)
Total Bilirubin: 0.4 mg/dL (ref 0.2–1.2)
Total Protein: 6.2 g/dL (ref 6.0–8.3)

## 2019-02-13 LAB — TSH: TSH: 3.07 u[IU]/mL (ref 0.35–4.50)

## 2019-02-13 LAB — VITAMIN D 25 HYDROXY (VIT D DEFICIENCY, FRACTURES): VITD: 82.34 ng/mL (ref 30.00–100.00)

## 2019-02-13 MED ORDER — AMLODIPINE-OLMESARTAN 5-40 MG PO TABS: 1.0000 | ORAL_TABLET | Freq: Every day | ORAL | 3 refills | Status: AC

## 2019-02-13 NOTE — Assessment & Plan Note (Signed)
On B12 

## 2019-02-13 NOTE — Assessment & Plan Note (Signed)
Mckenzie Young

## 2019-02-13 NOTE — Assessment & Plan Note (Signed)
No relapse 

## 2019-02-13 NOTE — Progress Notes (Signed)
Subjective:  Patient ID: Mckenzie Young, female    DOB: Aug 15, 1915  Age: [age] y.o. MRN: [id number]  CC: No chief complaint on file.   HPI [place] presents for HTN, B12 def - [place] is [age] yo today! New dog - poodle called Bowregard 5 lbs  Outpatient Medications Prior to Visit  Medication Sig Dispense Refill   amLODipine-olmesartan (AZOR) 5-40 MG tablet Take 1 tablet by mouth daily. Must keep upcoming appt to get refills 90 tablet 0   brimonidine (ALPHAGAN) 0.2 % ophthalmic solution Place into both eyes.     Cholecalciferol 1000 UNITS tablet Take 1,000 Units by mouth daily.       Cyanocobalamin (VITAMIN B-12) 500 MCG SUBL 1 tab SL qod 100 tablet 3   dorzolamide-timolol (COSOPT) 22.3-6.8 MG/ML ophthalmic solution Place into both eyes.     Fluticasone Furoate-Vilanterol (BREO ELLIPTA) 100-25 MCG/INH AEPB Inhale 1 puff into the lungs daily. 1 each 5   prednisoLONE acetate (PRED FORTE) 1 % ophthalmic suspension Place into both eyes.     No facility-administered medications prior to visit.     ROS: Review of Systems  Constitutional: Negative for activity change, appetite change, chills, fatigue and unexpected weight change.  HENT: Negative for congestion, mouth sores and sinus pressure.   Eyes: Positive for visual disturbance.  Respiratory: Negative for cough and chest tightness.   Gastrointestinal: Negative for abdominal pain and nausea.  Genitourinary: Negative for difficulty urinating, frequency and vaginal pain.  Musculoskeletal: Positive for gait problem. Negative for back pain.  Skin: Negative for pallor and rash.  Neurological: Negative for dizziness, tremors, weakness, numbness and headaches.  Psychiatric/Behavioral: Negative for confusion and sleep disturbance.    Objective:  BP 112/76 (BP Location: Left Arm, Patient Position: Sitting, Cuff Size: Normal)    Pulse 86    Temp 98.1 F (36.7 C) (Oral)    Ht 4\' 11"  (1.499 m)    Wt 103 lb (46.7 kg)    SpO2 97%     BMI 20.80 kg/m   BP Readings from Last 3 Encounters:  02/13/19 112/76  06/13/18 (!) 107/59  03/11/18 135/74    Wt Readings from Last 3 Encounters:  02/13/19 103 lb (46.7 kg)  09/22/17 107 lb (48.5 kg)  04/16/16 114 lb (51.7 kg)    Physical Exam Constitutional:      General: She is not in acute distress.    Appearance: She is well-developed.  HENT:     Head: Normocephalic.     Right Ear: External ear normal.     Left Ear: External ear normal.     Nose: Nose normal.  Eyes:     General:        Right eye: No discharge.        Left eye: No discharge.     Conjunctiva/sclera: Conjunctivae normal.     Pupils: Pupils are equal, round, and reactive to light.  Neck:     Musculoskeletal: Normal range of motion and neck supple.     Thyroid: No thyromegaly.     Vascular: No JVD.     Trachea: No tracheal deviation.  Cardiovascular:     Rate and Rhythm: Normal rate and regular rhythm.     Heart sounds: Normal heart sounds.  Pulmonary:     Effort: No respiratory distress.     Breath sounds: No stridor. No wheezing.  Abdominal:     General: Bowel sounds are normal. There is no distension.     Palpations: Abdomen is soft.  There is no mass.     Tenderness: There is no abdominal tenderness. There is no guarding or rebound.  Musculoskeletal:        General: No tenderness.  Lymphadenopathy:     Cervical: No cervical adenopathy.  Skin:    Findings: No erythema or rash.  Neurological:     Mental Status: She is oriented to person, place, and time.     Cranial Nerves: No cranial nerve deficit.     Motor: No abnormal muscle tone.     Coordination: Coordination normal.     Gait: Gait abnormal.     Deep Tendon Reflexes: Reflexes normal.  Psychiatric:        Behavior: Behavior normal.        Thought Content: Thought content normal.        Judgment: Judgment normal.   walker L eye blind  Lab Results  Component Value Date   WBC 7.0 09/22/2017   HGB 14.9 09/22/2017   HCT 45.4  09/22/2017   PLT 208.0 09/22/2017   GLUCOSE 97 09/22/2017   CHOL 214 (H) 10/10/2013   TRIG 246.0 (H) 10/10/2013   HDL 46.10 10/10/2013   LDLCALC 119 (H) 10/10/2013   ALT 10 09/22/2017   AST 20 09/22/2017   NA 137 09/22/2017   K 4.7 09/22/2017   CL 105 09/22/2017   CREATININE 1.60 (H) 09/22/2017   BUN 26 (H) 09/22/2017   CO2 27 09/22/2017   TSH 3.17 09/22/2017   HGBA1C 5.7 09/22/2017    Ct Head Wo Contrast  Result Date: 03/10/2018 CLINICAL DATA:  Patient fell, striking the right side of her head. EXAM: CT HEAD WITHOUT CONTRAST CT CERVICAL SPINE WITHOUT CONTRAST TECHNIQUE: Multidetector CT imaging of the head and cervical spine was performed following the standard protocol without intravenous contrast. Multiplanar CT image reconstructions of the cervical spine were also generated. COMPARISON:  None. FINDINGS: CT HEAD FINDINGS Brain: Diffuse cerebral atrophy. Mild ventricular dilatation consistent with central atrophy. Low-attenuation changes in the deep white matter consistent with small vessel ischemia. No mass effect or midline shift. No abnormal extra-axial fluid collections. Gray-white matter junctions are distinct. Basal cisterns are not effaced. No acute intracranial hemorrhage. Vascular: Moderate intracranial arterial vascular calcifications. Skull: Calvarium appears intact. No acute depressed skull fractures. Sinuses/Orbits: Paranasal sinuses and mastoid air cells are clear. Old appearing nasal bone deformities. Other: Subcutaneous scalp hematoma over the right frontotemporal region. CT CERVICAL SPINE FINDINGS Alignment: Straightening of usual cervical lordosis. This is probably due to patient positioning or degenerative change but ligamentous injury or muscle spasm could also have this appearance and are not excluded. No anterior subluxation. Normal alignment of the facet joints. Skull base and vertebrae: Skull base appears intact. Degenerative changes at the C1-2 level with some basilar  invagination. Soft tissue calcifications about the odontoid process probably represent ligamentous calcification although avulsion injury is not excluded. No vertebral compression deformities. No focal bone lesion or bone destruction. Bone cortex appears intact. Soft tissues and spinal canal: No prevertebral soft tissue swelling. No abnormal paraspinal soft tissue mass or infiltration. Disc levels: Degenerative changes throughout the cervical spine with narrowed interspaces and endplate hypertrophic changes. Degenerative changes throughout the facet joints. Upper chest: Lung apices are clear. Prominent aortic and vascular calcifications. Other: None. IMPRESSION: 1. No acute intracranial abnormalities. Diffuse atrophy and small vessel ischemic changes. 2. Subcutaneous scalp hematoma over the right frontotemporal region. 3. Nonspecific straightening of usual cervical lordosis. Prominent degenerative changes at C1-2. Degenerative changes throughout the cervical  spine. No acute displaced fractures identified in the cervical spine. Electronically Signed   By: Lucienne Capers M.D.   On: 03/10/2018 23:43   Ct Cervical Spine Wo Contrast  Result Date: 03/10/2018 CLINICAL DATA:  Patient fell, striking the right side of her head. EXAM: CT HEAD WITHOUT CONTRAST CT CERVICAL SPINE WITHOUT CONTRAST TECHNIQUE: Multidetector CT imaging of the head and cervical spine was performed following the standard protocol without intravenous contrast. Multiplanar CT image reconstructions of the cervical spine were also generated. COMPARISON:  None. FINDINGS: CT HEAD FINDINGS Brain: Diffuse cerebral atrophy. Mild ventricular dilatation consistent with central atrophy. Low-attenuation changes in the deep white matter consistent with small vessel ischemia. No mass effect or midline shift. No abnormal extra-axial fluid collections. Gray-white matter junctions are distinct. Basal cisterns are not effaced. No acute intracranial hemorrhage.  Vascular: Moderate intracranial arterial vascular calcifications. Skull: Calvarium appears intact. No acute depressed skull fractures. Sinuses/Orbits: Paranasal sinuses and mastoid air cells are clear. Old appearing nasal bone deformities. Other: Subcutaneous scalp hematoma over the right frontotemporal region. CT CERVICAL SPINE FINDINGS Alignment: Straightening of usual cervical lordosis. This is probably due to patient positioning or degenerative change but ligamentous injury or muscle spasm could also have this appearance and are not excluded. No anterior subluxation. Normal alignment of the facet joints. Skull base and vertebrae: Skull base appears intact. Degenerative changes at the C1-2 level with some basilar invagination. Soft tissue calcifications about the odontoid process probably represent ligamentous calcification although avulsion injury is not excluded. No vertebral compression deformities. No focal bone lesion or bone destruction. Bone cortex appears intact. Soft tissues and spinal canal: No prevertebral soft tissue swelling. No abnormal paraspinal soft tissue mass or infiltration. Disc levels: Degenerative changes throughout the cervical spine with narrowed interspaces and endplate hypertrophic changes. Degenerative changes throughout the facet joints. Upper chest: Lung apices are clear. Prominent aortic and vascular calcifications. Other: None. IMPRESSION: 1. No acute intracranial abnormalities. Diffuse atrophy and small vessel ischemic changes. 2. Subcutaneous scalp hematoma over the right frontotemporal region. 3. Nonspecific straightening of usual cervical lordosis. Prominent degenerative changes at C1-2. Degenerative changes throughout the cervical spine. No acute displaced fractures identified in the cervical spine. Electronically Signed   By: Lucienne Capers M.D.   On: 03/10/2018 23:43    Assessment & Plan:   There are no diagnoses linked to this encounter.   No orders of the defined  types were placed in this encounter.    Follow-up: No follow-ups on file.  Walker Kehr, MD

## 2019-02-13 NOTE — Patient Instructions (Signed)
Sign up for [place] [employer] library ( via Libby app on your phone or your ipad). If you don't have a library card  - go to any library branch. They will set you up in 15 minutes. It is free. You can check out books to read and to listen, check out magazines and newspapers, movies etc.   

## 2019-02-13 NOTE — Assessment & Plan Note (Signed)
Walker

## 2019-02-24 DIAGNOSIS — M79629 Pain in unspecified upper arm: Secondary | ICD-10-CM | POA: Diagnosis not present

## 2019-02-24 DIAGNOSIS — M85821 Other specified disorders of bone density and structure, right upper arm: Secondary | ICD-10-CM | POA: Diagnosis not present

## 2019-02-24 DIAGNOSIS — M7520 Bicipital tendinitis, unspecified shoulder: Secondary | ICD-10-CM | POA: Diagnosis not present

## 2019-02-24 DIAGNOSIS — M79621 Pain in right upper arm: Secondary | ICD-10-CM | POA: Diagnosis not present

## 2019-03-01 DIAGNOSIS — Z8669 Personal history of other diseases of the nervous system and sense organs: Secondary | ICD-10-CM | POA: Diagnosis not present

## 2019-03-01 DIAGNOSIS — H1851 Endothelial corneal dystrophy: Secondary | ICD-10-CM | POA: Diagnosis not present

## 2019-03-01 DIAGNOSIS — Z961 Presence of intraocular lens: Secondary | ICD-10-CM | POA: Diagnosis not present

## 2019-03-01 DIAGNOSIS — Z947 Corneal transplant status: Secondary | ICD-10-CM | POA: Diagnosis not present

## 2019-03-01 DIAGNOSIS — H401133 Primary open-angle glaucoma, bilateral, severe stage: Secondary | ICD-10-CM | POA: Diagnosis not present

## 2019-03-10 DIAGNOSIS — M12811 Other specific arthropathies, not elsewhere classified, right shoulder: Secondary | ICD-10-CM | POA: Diagnosis not present

## 2019-03-10 DIAGNOSIS — M79621 Pain in right upper arm: Secondary | ICD-10-CM | POA: Diagnosis not present

## 2019-03-14 ENCOUNTER — Other Ambulatory Visit: Payer: Self-pay

## 2019-03-14 ENCOUNTER — Encounter: Payer: Self-pay | Admitting: Podiatry

## 2019-03-14 ENCOUNTER — Ambulatory Visit: Payer: PPO | Admitting: Podiatry

## 2019-03-14 DIAGNOSIS — M79675 Pain in left toe(s): Secondary | ICD-10-CM

## 2019-03-14 DIAGNOSIS — B351 Tinea unguium: Secondary | ICD-10-CM | POA: Diagnosis not present

## 2019-03-14 DIAGNOSIS — M79674 Pain in right toe(s): Secondary | ICD-10-CM

## 2019-03-14 NOTE — Patient Instructions (Signed)

## 2019-03-19 NOTE — Progress Notes (Signed)
Subjective: Mckenzie Young is seen today for follow up painful, elongated, thickened toenails 1-5 b/l feet that she cannot cut. Pain interferes with daily activities. Aggravating factor includes wearing enclosed shoe gear and relieved with periodic debridement.  Current Outpatient Medications on File Prior to Visit  Medication Sig  . amLODipine-olmesartan (AZOR) 5-40 MG tablet Take 1 tablet by mouth daily.  . brimonidine (ALPHAGAN) 0.2 % ophthalmic solution Place into both eyes.  . Cholecalciferol 1000 UNITS tablet Take 1,000 Units by mouth daily.    . Cyanocobalamin (VITAMIN B-12) 500 MCG SUBL 1 tab SL qod  . dorzolamide-timolol (COSOPT) 22.3-6.8 MG/ML ophthalmic solution Place into both eyes.  . Fluticasone Furoate-Vilanterol (BREO ELLIPTA) 100-25 MCG/INH AEPB Inhale 1 puff into the lungs daily.  . nabumetone (RELAFEN) 500 MG tablet Take 500 mg by mouth daily as needed.  . prednisoLONE acetate (PRED FORTE) 1 % ophthalmic suspension Place into both eyes.  . predniSONE (DELTASONE) 5 MG tablet TAKE 1 TABLET BY MOUTH TWICE A DAY FOR 4 DAYS   No current facility-administered medications on file prior to visit.      No Known Allergies   Objective:  Vascular Examination: Capillary refill time immediate x 10 digits.  Dorsalis pedis present b/l.  Posterior tibial pulses present b/l.  Digital hair absent b/l.  Skin temperature gradient WNL b/l.   Dermatological Examination: Skin thin, shiny and atrophic b/l.  Toenails 1-5 b/l discolored, thick, dystrophic with subungual debris and pain with palpation to nailbeds due to thickness of nails.  Musculoskeletal: Muscle strength 5/5 to all LE muscle groups b/l.  No pain, crepitus or joint limitation noted with ROM.   Neurological Examination: Protective sensation intact 5/5 with 10 gram monofilament bilaterally.  Epicritic sensation present bilaterally.  Assessment: Painful onychomycosis toenails 1-5 b/l   Plan: 1. Toenails 1-5  b/l were debrided in length and girth without iatrogenic bleeding. 2. Patient to continue soft, supportive shoe gear 3. Patient to report any pedal injuries to medical professional immediately. 4. Follow up 4  months.  5. Patient/POA to call should there be a concern in the interim.

## 2019-03-22 DIAGNOSIS — G8194 Hemiplegia, unspecified affecting left nondominant side: Secondary | ICD-10-CM | POA: Diagnosis not present

## 2019-03-22 DIAGNOSIS — M109 Gout, unspecified: Secondary | ICD-10-CM | POA: Diagnosis not present

## 2019-03-22 DIAGNOSIS — G459 Transient cerebral ischemic attack, unspecified: Secondary | ICD-10-CM | POA: Diagnosis not present

## 2019-03-22 DIAGNOSIS — R531 Weakness: Secondary | ICD-10-CM | POA: Diagnosis not present

## 2019-03-22 DIAGNOSIS — I10 Essential (primary) hypertension: Secondary | ICD-10-CM | POA: Diagnosis not present

## 2019-03-22 DIAGNOSIS — Z823 Family history of stroke: Secondary | ICD-10-CM | POA: Diagnosis not present

## 2019-03-22 DIAGNOSIS — R29722 NIHSS score 22: Secondary | ICD-10-CM | POA: Diagnosis not present

## 2019-03-22 DIAGNOSIS — H409 Unspecified glaucoma: Secondary | ICD-10-CM | POA: Diagnosis not present

## 2019-03-22 DIAGNOSIS — I6501 Occlusion and stenosis of right vertebral artery: Secondary | ICD-10-CM | POA: Diagnosis not present

## 2019-03-22 DIAGNOSIS — R471 Dysarthria and anarthria: Secondary | ICD-10-CM | POA: Diagnosis not present

## 2019-03-22 DIAGNOSIS — R4781 Slurred speech: Secondary | ICD-10-CM | POA: Diagnosis not present

## 2019-03-22 DIAGNOSIS — Z20828 Contact with and (suspected) exposure to other viral communicable diseases: Secondary | ICD-10-CM | POA: Diagnosis not present

## 2019-03-22 DIAGNOSIS — I63231 Cerebral infarction due to unspecified occlusion or stenosis of right carotid arteries: Secondary | ICD-10-CM | POA: Diagnosis not present

## 2019-03-22 DIAGNOSIS — Z8249 Family history of ischemic heart disease and other diseases of the circulatory system: Secondary | ICD-10-CM | POA: Diagnosis not present

## 2019-03-22 DIAGNOSIS — Z515 Encounter for palliative care: Secondary | ICD-10-CM | POA: Diagnosis not present

## 2019-03-22 DIAGNOSIS — R4182 Altered mental status, unspecified: Secondary | ICD-10-CM | POA: Diagnosis not present

## 2019-03-22 DIAGNOSIS — Z7189 Other specified counseling: Secondary | ICD-10-CM | POA: Diagnosis not present

## 2019-03-22 DIAGNOSIS — Z743 Need for continuous supervision: Secondary | ICD-10-CM | POA: Diagnosis not present

## 2019-03-22 DIAGNOSIS — Z66 Do not resuscitate: Secondary | ICD-10-CM | POA: Diagnosis not present

## 2019-03-22 DIAGNOSIS — Z79899 Other long term (current) drug therapy: Secondary | ICD-10-CM | POA: Diagnosis not present

## 2019-03-22 DIAGNOSIS — R29810 Facial weakness: Secondary | ICD-10-CM | POA: Diagnosis not present

## 2019-03-22 DIAGNOSIS — Z9282 Status post administration of tPA (rtPA) in a different facility within the last 24 hours prior to admission to current facility: Secondary | ICD-10-CM | POA: Diagnosis not present

## 2019-03-22 DIAGNOSIS — I639 Cerebral infarction, unspecified: Secondary | ICD-10-CM | POA: Diagnosis not present

## 2019-03-22 DIAGNOSIS — R279 Unspecified lack of coordination: Secondary | ICD-10-CM | POA: Diagnosis not present

## 2019-03-22 DIAGNOSIS — R4701 Aphasia: Secondary | ICD-10-CM | POA: Diagnosis not present

## 2019-03-22 DIAGNOSIS — I63511 Cerebral infarction due to unspecified occlusion or stenosis of right middle cerebral artery: Secondary | ICD-10-CM | POA: Diagnosis not present

## 2019-03-23 ENCOUNTER — Telehealth: Payer: Self-pay | Admitting: Internal Medicine

## 2019-03-23 NOTE — Telephone Encounter (Signed)
FYI

## 2019-03-23 NOTE — Telephone Encounter (Signed)
Sheree from [employer] of the [employer].  States that pt had a massive stroke and is currently and is currently at [facility].  Family wants to take pt home to hospice care. Sheree needs to know if Dr. Alain Marion will be attending of record.

## 2019-03-24 MED ORDER — ONDANSETRON HCL 4 MG/2ML IJ SOLN
4.00 | INTRAMUSCULAR | Status: DC
Start: ? — End: 2019-03-24

## 2019-03-24 MED ORDER — GLYCOPYRROLATE 0.2 MG/ML IJ SOLN
0.20 | INTRAMUSCULAR | Status: DC
Start: ? — End: 2019-03-24

## 2019-03-24 MED ORDER — DURASORB UNDERPAD MISC
2.00 | Status: DC
Start: ? — End: 2019-03-24

## 2019-03-24 MED ORDER — CARBOXYMETHYLCELLULOSE SODIUM 0.5 % OP SOLN
1.00 | OPHTHALMIC | Status: DC
Start: ? — End: 2019-03-24

## 2019-03-24 MED ORDER — POLYETHYLENE GLYCOL 3350 17 G PO PACK
17.00 | PACK | ORAL | Status: DC
Start: ? — End: 2019-03-24

## 2019-03-24 MED ORDER — LORAZEPAM 2 MG/ML IJ SOLN
1.00 | INTRAMUSCULAR | Status: DC
Start: ? — End: 2019-03-24

## 2019-03-26 NOTE — Telephone Encounter (Signed)
I am very sorry!  I will be the attending of record.  Thank you

## 2019-03-27 NOTE — Telephone Encounter (Signed)
Sheree informed of below.

## 2019-03-29 ENCOUNTER — Telehealth: Payer: Self-pay

## 2019-03-29 NOTE — Telephone Encounter (Signed)
Received dc from [employer].   DC is for cremation and a patient of Doctor Plotnikov.  DC will be taken to [facility] for signature.   On 03/31/2019 Received dc back from Doctor Plotnkov.  I called the funeral home to let them know the dc is ready for pickup and also faxed a copy to the funeral home.

## 2019-04-02 DEATH — deceased

## 2020-02-14 ENCOUNTER — Encounter: Payer: PPO | Admitting: Internal Medicine

## 2020-02-29 IMAGING — CT CT CERVICAL SPINE W/O CM
4 of 6 series · 16 of 33 positions shown, 17 images · non-contrast
Comparison: None.

CLINICAL DATA: Patient fell, striking the right side of her head.

EXAM:
CT HEAD WITHOUT CONTRAST
CT CERVICAL SPINE WITHOUT CONTRAST
TECHNIQUE: Multidetector CT imaging of the head and cervical spine was
performed following the standard protocol without intravenous
contrast. Multiplanar CT image reconstructions of the cervical spine
were also generated.

[Series 5: coronal soft tissue · coronal · 0.28mm/px · 3 of 56 slices shown]
[im 12/56  bone]
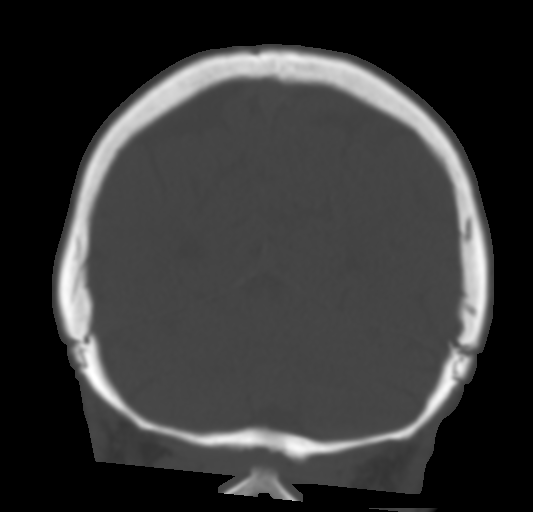
[im 23/56  bone]
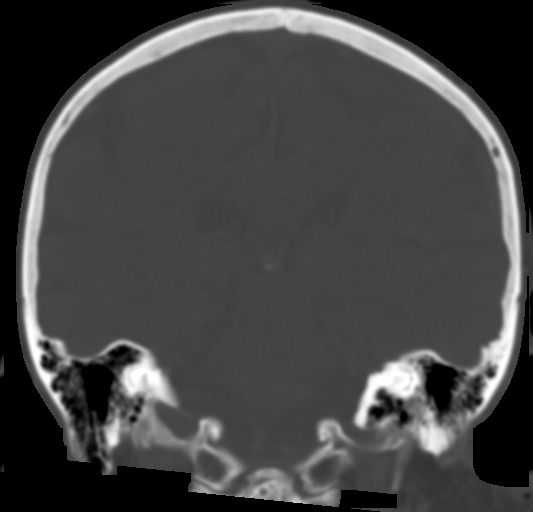
[im 34/56  bone]
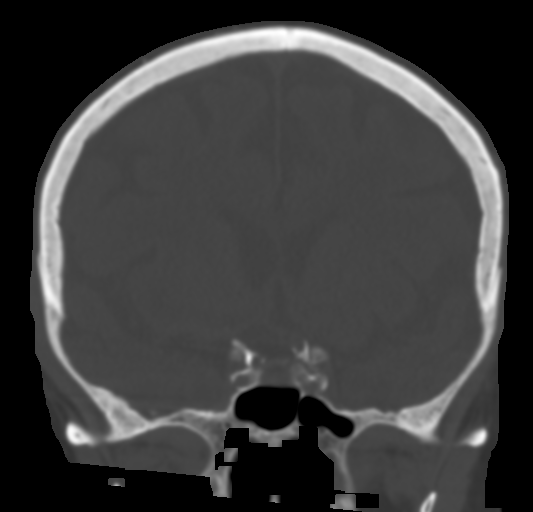

[Series 9: c spine soft · axial · 0.35mm/px · z∈[+1286,+1390]mm · 4 of 86 slices shown]
[im 18/86  soft-tissue]
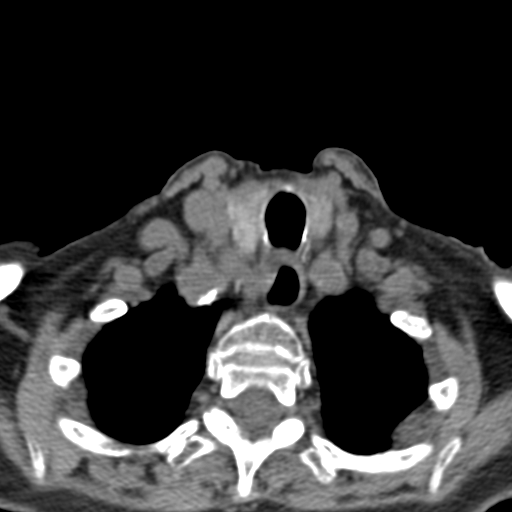
[im 35/86  soft-tissue]
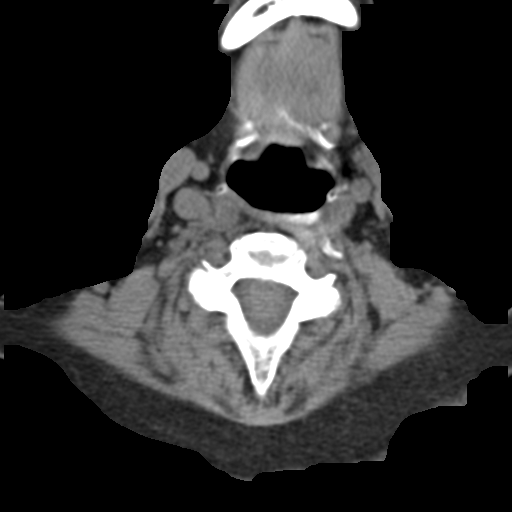
[im 52/86  soft-tissue]
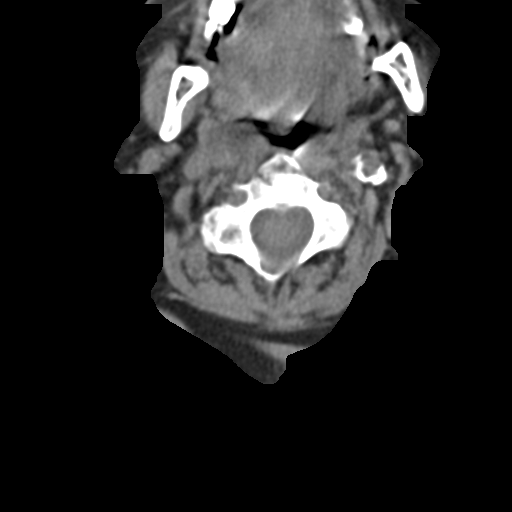
[im 69/86  soft-tissue]
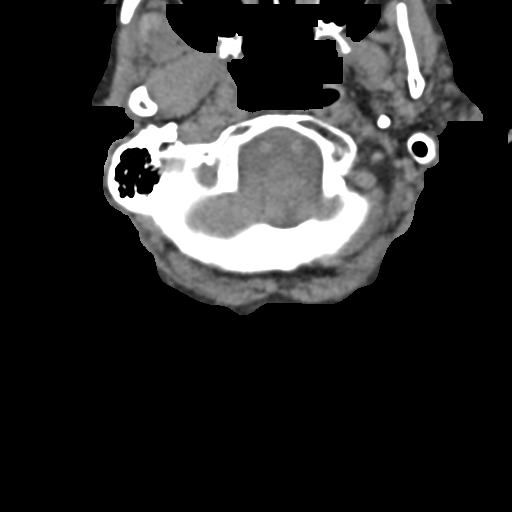

[Series 10: orthogonal bone · axial · 0.23mm/px · z∈[+1276,+1377]mm · 4 of 88 slices shown, 5 images]
[im 18/88  soft-tissue]
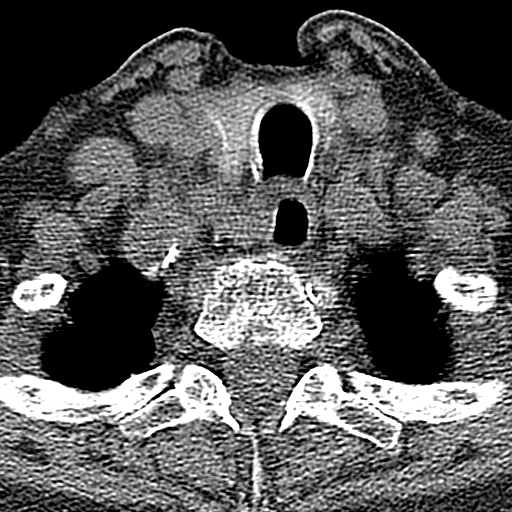
[im 18/88  bone]
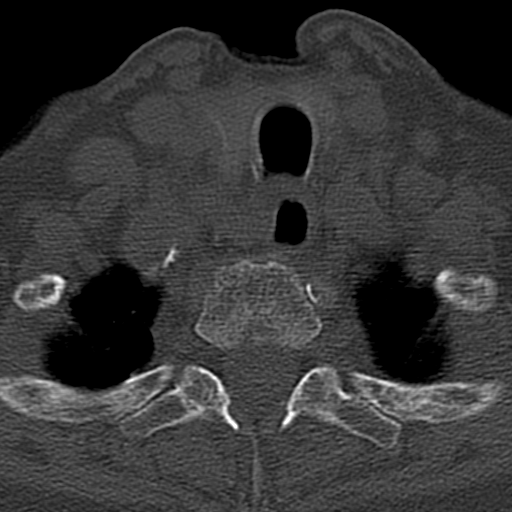
[im 35/88  bone]
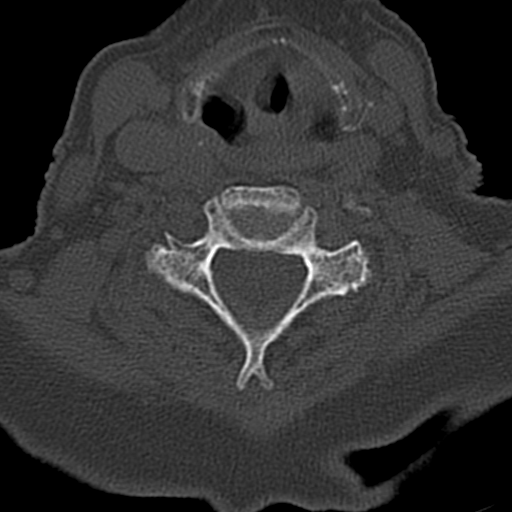
[im 53/88  bone]
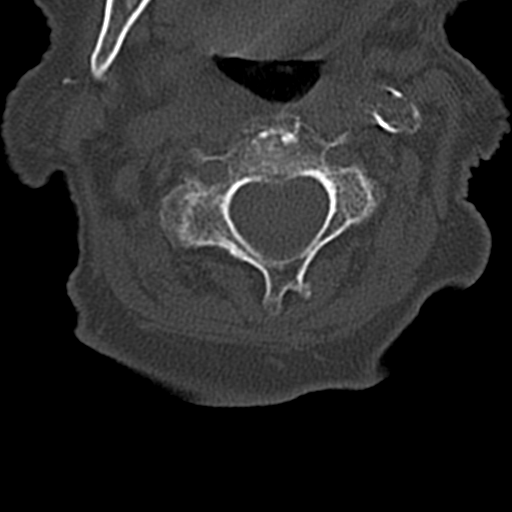
[im 70/88  bone]
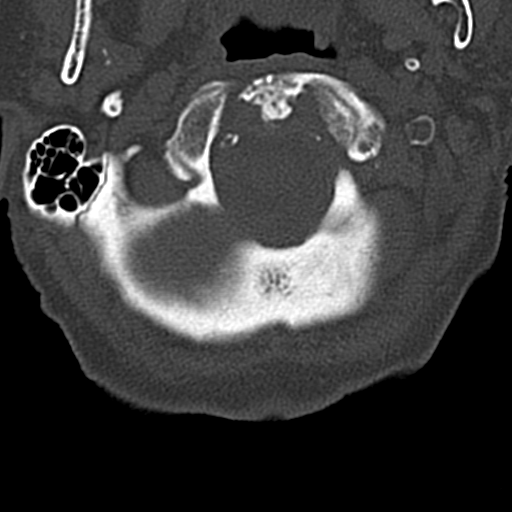

[Series 12: sagittal bone · sagittal · 0.28mm/px · 5 of 54 slices shown]
[im 9/54  bone]
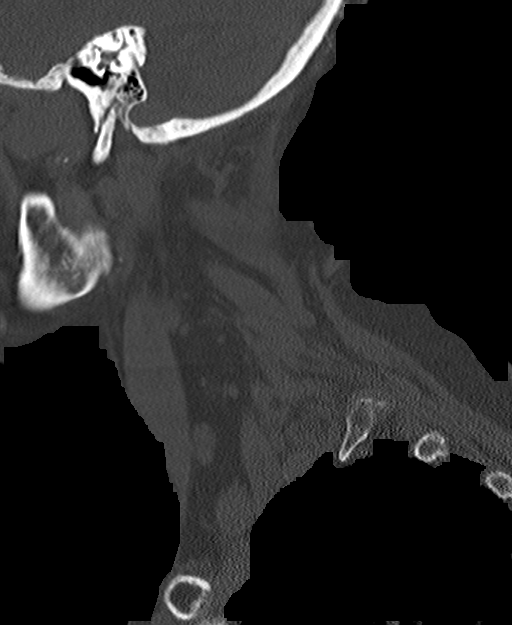
[im 18/54  bone]
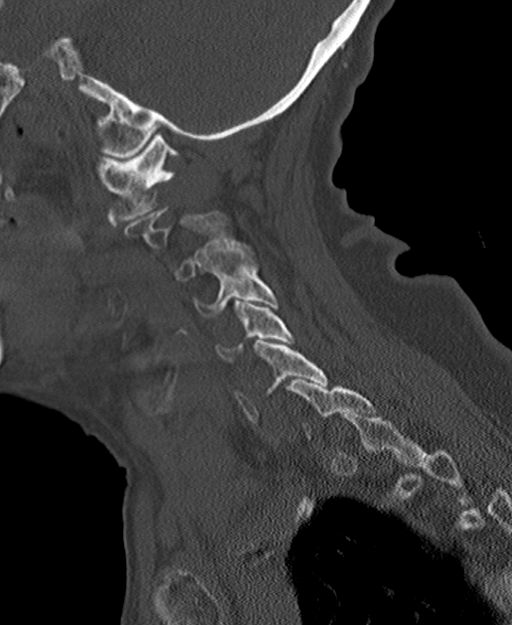
[im 27/54  bone]
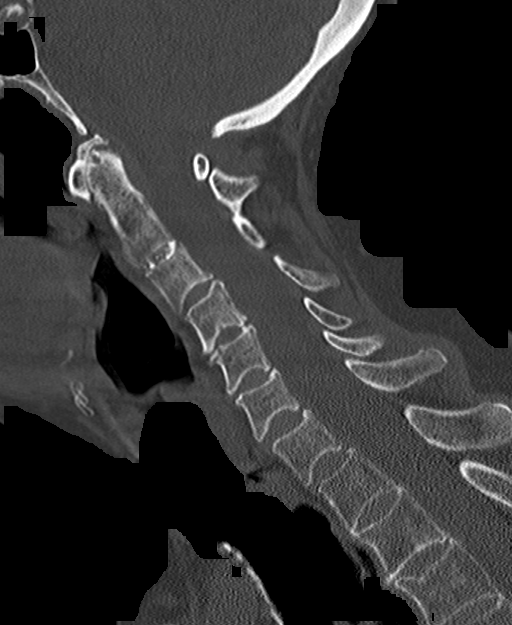
[im 36/54  bone]
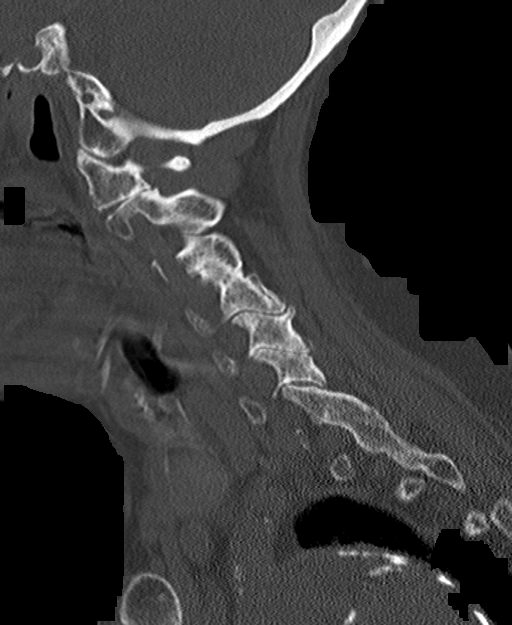
[im 45/54  bone]
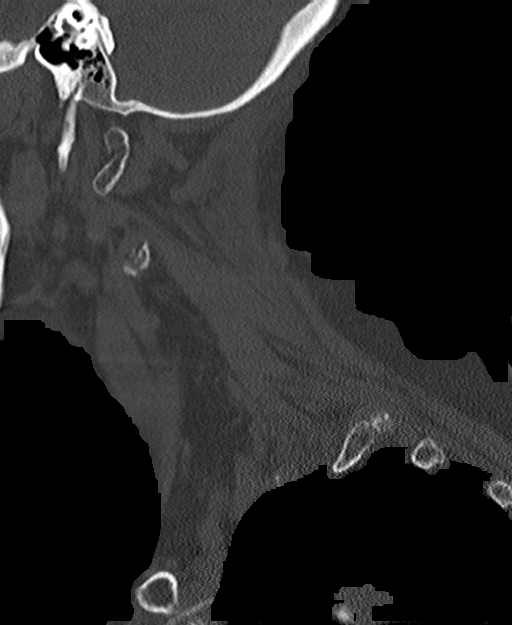

[16 of 33 positions shown; findings below may reference images not displayed]

FINDINGS: CT HEAD FINDINGS

Brain: Diffuse cerebral atrophy. Mild ventricular dilatation
consistent with central atrophy. Low-attenuation changes in the deep
white matter consistent with small vessel ischemia. No mass effect
or midline shift. No abnormal extra-axial fluid collections.
Gray-white matter junctions are distinct. Basal cisterns are not
effaced. No acute intracranial hemorrhage.

Vascular: Moderate intracranial arterial vascular calcifications.

Skull: Calvarium appears intact. No acute depressed skull fractures.

Sinuses/Orbits: Paranasal sinuses and mastoid air cells are clear.
Old appearing nasal bone deformities.

Other: Subcutaneous scalp hematoma over the right frontotemporal
region.

CT CERVICAL SPINE FINDINGS

Alignment: Straightening of usual cervical lordosis. This is
probably due to patient positioning or degenerative change but
ligamentous injury or muscle spasm could also have this appearance
and are not excluded. No anterior subluxation. Normal alignment of
the facet joints.

Skull base and vertebrae: Skull base appears intact. Degenerative
changes at the C1-2 level with some basilar invagination. Soft
tissue calcifications about the odontoid process probably represent
ligamentous calcification although avulsion injury is not excluded.
No vertebral compression deformities. No focal bone lesion or bone
destruction. Bone cortex appears intact.

Soft tissues and spinal canal: No prevertebral soft tissue swelling.
No abnormal paraspinal soft tissue mass or infiltration.

Disc levels: Degenerative changes throughout the cervical spine with
narrowed interspaces and endplate hypertrophic changes. Degenerative
changes throughout the facet joints.

Upper chest: Lung apices are clear. Prominent aortic and vascular
calcifications.

Other: None.
IMPRESSION: 1. No acute intracranial abnormalities. Diffuse atrophy and small
vessel ischemic changes.
2. Subcutaneous scalp hematoma over the right frontotemporal region.
3. Nonspecific straightening of usual cervical lordosis. Prominent
degenerative changes at C1-2. Degenerative changes throughout the
cervical spine. No acute displaced fractures identified in the
cervical spine.

## 2023-04-05 ENCOUNTER — Encounter: Payer: Self-pay | Admitting: Podiatry
